# Patient Record
Sex: Male | Born: 1960 | Race: White | Hispanic: No | Marital: Single | Smoking: Current every day smoker
Health system: Southern US, Community
[De-identification: ages and names within clinical notes are randomized; demographics above are authoritative.]

## PROBLEM LIST (undated history)

## (undated) DIAGNOSIS — IMO0002 Reserved for concepts with insufficient information to code with codable children: Secondary | ICD-10-CM

## (undated) DIAGNOSIS — J449 Chronic obstructive pulmonary disease, unspecified: Secondary | ICD-10-CM

## (undated) DIAGNOSIS — M1990 Unspecified osteoarthritis, unspecified site: Secondary | ICD-10-CM

## (undated) DIAGNOSIS — B1920 Unspecified viral hepatitis C without hepatic coma: Secondary | ICD-10-CM

## (undated) HISTORY — PX: CERVICAL FUSION: SHX112

## (undated) HISTORY — PX: NECK SURGERY: SHX720

---

## 2014-01-31 ENCOUNTER — Emergency Department (HOSPITAL_COMMUNITY): Payer: Self-pay

## 2014-01-31 ENCOUNTER — Emergency Department (HOSPITAL_COMMUNITY)
Admission: EM | Admit: 2014-01-31 | Discharge: 2014-01-31 | Disposition: A | Discharge status: Home / Self Care | Payer: Self-pay | Attending: Emergency Medicine | Admitting: Emergency Medicine

## 2014-01-31 ENCOUNTER — Encounter (HOSPITAL_COMMUNITY): Payer: Self-pay | Admitting: Emergency Medicine

## 2014-01-31 DIAGNOSIS — IMO0001 Reserved for inherently not codable concepts without codable children: Secondary | ICD-10-CM | POA: Insufficient documentation

## 2014-01-31 DIAGNOSIS — Z8739 Personal history of other diseases of the musculoskeletal system and connective tissue: Secondary | ICD-10-CM | POA: Insufficient documentation

## 2014-01-31 DIAGNOSIS — J449 Chronic obstructive pulmonary disease, unspecified: Secondary | ICD-10-CM | POA: Insufficient documentation

## 2014-01-31 DIAGNOSIS — F17200 Nicotine dependence, unspecified, uncomplicated: Secondary | ICD-10-CM | POA: Insufficient documentation

## 2014-01-31 DIAGNOSIS — R404 Transient alteration of awareness: Secondary | ICD-10-CM | POA: Insufficient documentation

## 2014-01-31 DIAGNOSIS — M545 Low back pain, unspecified: Secondary | ICD-10-CM | POA: Insufficient documentation

## 2014-01-31 DIAGNOSIS — R55 Syncope and collapse: Secondary | ICD-10-CM | POA: Insufficient documentation

## 2014-01-31 DIAGNOSIS — M7918 Myalgia, other site: Secondary | ICD-10-CM

## 2014-01-31 DIAGNOSIS — M542 Cervicalgia: Secondary | ICD-10-CM | POA: Insufficient documentation

## 2014-01-31 DIAGNOSIS — J4489 Other specified chronic obstructive pulmonary disease: Secondary | ICD-10-CM | POA: Insufficient documentation

## 2014-01-31 DIAGNOSIS — R51 Headache: Secondary | ICD-10-CM | POA: Insufficient documentation

## 2014-01-31 HISTORY — DX: Reserved for concepts with insufficient information to code with codable children: IMO0002

## 2014-01-31 HISTORY — DX: Chronic obstructive pulmonary disease, unspecified: J44.9

## 2014-01-31 HISTORY — DX: Unspecified osteoarthritis, unspecified site: M19.90

## 2014-01-31 LAB — BASIC METABOLIC PANEL
Anion gap: 10 (ref 5–15)
BUN: 7 mg/dL (ref 6–23)
CO2: 25 mEq/L (ref 19–32)
Calcium: 8.9 mg/dL (ref 8.4–10.5)
Chloride: 107 mEq/L (ref 96–112)
Creatinine, Ser: 0.7 mg/dL (ref 0.50–1.35)
GFR calc non Af Amer: >90 mL/min (ref >90)
GLUCOSE: 79 mg/dL (ref 70–99)
POTASSIUM: 4.6 meq/L (ref 3.7–5.3)
Sodium: 142 mEq/L (ref 137–147)

## 2014-01-31 LAB — CBC
HEMATOCRIT: 44.2 % (ref 39.0–52.0)
HEMOGLOBIN: 14.9 g/dL (ref 13.0–17.0)
MCH: 32.5 pg (ref 26.0–34.0)
MCHC: 33.7 g/dL (ref 30.0–36.0)
MCV: 96.3 fL (ref 78.0–100.0)
Platelets: 216 10*3/uL (ref 150–400)
RBC: 4.59 MIL/uL (ref 4.22–5.81)
RDW: 14.1 % (ref 11.5–15.5)
WBC: 2.9 10*3/uL — AB (ref 4.0–10.5)

## 2014-01-31 MED ORDER — HYDROMORPHONE HCL PF 1 MG/ML IJ SOLN: 2.0000 mg | Freq: Once | INTRAMUSCULAR | Status: DC

## 2014-01-31 MED ORDER — HYDROMORPHONE HCL PF 1 MG/ML IJ SOLN
1.0000 mg | Freq: Once | INTRAMUSCULAR | Status: AC
  Administered 2014-01-31: 1 mg via INTRAVENOUS
  Filled 2014-01-31: qty 1

## 2014-01-31 MED ORDER — ONDANSETRON HCL 4 MG/2ML IJ SOLN
4.0000 mg | Freq: Once | INTRAMUSCULAR | Status: AC
  Administered 2014-01-31: 4 mg via INTRAVENOUS
  Filled 2014-01-31: qty 2

## 2014-01-31 MED ORDER — SODIUM CHLORIDE 0.9 % IV BOLUS (SEPSIS)
1000.0000 mL | Freq: Once | INTRAVENOUS | Status: AC
  Administered 2014-01-31: 1000 mL via INTRAVENOUS

## 2014-01-31 MED ORDER — TRAMADOL HCL 50 MG PO TABS: 50.0000 mg | ORAL_TABLET | Freq: Four times a day (QID) | ORAL | Status: DC | PRN

## 2014-01-31 NOTE — Discharge Instructions (Signed)
Please read and follow all provided instructions.  Your diagnoses today include:  1. Syncope, unspecified syncope type   2. Musculoskeletal pain     Tests performed today include:  An EKG of your heart  Blood counts and electrolytes  Vital signs. See below for your results today.   Medications prescribed:   None  Take any prescribed medications only as directed.  Follow-up instructions: Please follow-up with your primary care provider as soon as you can for further evaluation of your symptoms.   Return instructions:  SEEK IMMEDIATE MEDICAL ATTENTION IF:  You have severe chest pain, especially if the pain is crushing or pressure-like and spreads to the arms, back, neck, or jaw, or if you have sweating, nausea (feeling sick to your stomach), or shortness of breath. THIS IS AN EMERGENCY. Don't wait to see if the pain will go away. Get medical help at once. Call 911 or [phone] (operator). DO NOT drive yourself to the hospital.   Your chest pain gets worse and does not go away with rest.   You have an attack of chest pain lasting longer than usual, despite rest and treatment with the medications your caregiver has prescribed.   You wake from sleep with chest pain or shortness of breath.  You feel dizzy or faint.  You have chest pain not typical of your usual pain for which you originally saw your caregiver.   You have any other emergent concerns regarding your health.  Your vital signs today were: BP 141/98   Pulse 65   Temp(Src) 98 F (36.7 C)   Resp 12   Ht 5\' 9"  (1.753 m)   Wt 140 lb (63.504 kg)   BMI 20.67 kg/m2   SpO2 100% If your blood pressure (BP) was elevated above 135/85 this visit, please have this repeated by your doctor within one month. --------------

## 2014-01-31 NOTE — ED Provider Notes (Signed)
Medical screening examination/treatment/procedure(s) were performed by non-physician practitioner and as supervising physician I was immediately available for consultation/collaboration.   EKG Interpretation   Date/Time:  Thursday January 31 2014 10:21:48 EDT Ventricular Rate:  65 PR Interval:  136 QRS Duration: 95 QT Interval:  411 QTC Calculation: 427 R Axis:   79 Text Interpretation:  Sinus rhythm Probable anteroseptal infarct, old No  prior for comparison Confirmed by HORTON  MD, COURTNEY ([id number]) on  01/31/2014 10:40:01 AM        Shon Batonourtney F Horton, MD 01/31/14 1909

## 2014-01-31 NOTE — ED Provider Notes (Signed)
CSN: [id number]     Arrival date & time 01/31/14  4098 History   First MD Initiated Contact with Patient 01/31/14 1004     Chief Complaint  Patient presents with  . Loss of Consciousness     (Consider location/radiation/quality/duration/timing/severity/associated sxs/prior Treatment) HPI Comments: Patient with no past cardiac history presents with complaint of syncope and pain after a fall. Patient states he was walking from his mailbox when he began to feel lightheaded and nauseous. He felt this way for about 60 seconds and states that he saw spots in his vision and then passed out and lost consciousness. Patient awoke on the ground. He complains of neck pain, lower back pain. He thinks that he likely struck his head when he fell. He also complains of decreased sensation in bilateral hands. Patient has a history of herniated disc surgery in his neck. No upper or lower extremity pain. No vomiting. No urinary symptoms. Patient states that he was engaged in strenuous activity outside yesterday but took care to hydrate himself well, after having a heat episode of heat exhaustion several weeks ago. The onset of this condition was acute. The course is constant. Aggravating factors: none. Alleviating factors: none.     Patient is a 53 y.o. male presenting with syncope. The history is provided by the patient.  Loss of Consciousness Associated symptoms: headaches   Associated symptoms: no chest pain, no confusion, no dizziness, no fever, no nausea, no shortness of breath, no vomiting and no weakness     Past Medical History  Diagnosis Date  . COPD (chronic obstructive pulmonary disease)   . Arthritis   . Herniated disc     L6   Past Surgical History  Procedure Laterality Date  . Cervical fusion      c3, c4, c5, c6   No family history on file. History  Substance Use Topics  . Smoking status: Current Every Day Smoker    Types: Cigarettes  . Smokeless tobacco: Not on file  . Alcohol Use:  Yes     Comment: beer a night    Review of Systems  Constitutional: Negative for fever and fatigue.  HENT: Negative for rhinorrhea, sore throat and tinnitus.   Eyes: Negative for photophobia, pain, redness and visual disturbance.  Respiratory: Negative for cough and shortness of breath.   Cardiovascular: Positive for syncope. Negative for chest pain.  Gastrointestinal: Negative for nausea, vomiting, abdominal pain and diarrhea.  Genitourinary: Negative for dysuria.  Musculoskeletal: Positive for back pain and neck pain. Negative for gait problem and myalgias.  Skin: Negative for rash and wound.  Neurological: Positive for headaches. Negative for dizziness, weakness, light-headedness and numbness.  Psychiatric/Behavioral: Negative for confusion and decreased concentration.    Allergies  Naproxen  Home Medications   Prior to Admission medications   Not on File   BP 159/102  Pulse 72  Temp(Src) 98 F (36.7 C)  Resp 18  Ht 5\' 9"  (1.753 m)  Wt 140 lb (63.504 kg)  BMI 20.67 kg/m2  SpO2 99%  Physical Exam  Nursing note and vitals reviewed. Constitutional: He is oriented to person, place, and time. He appears well-developed and well-nourished.  HENT:  Head: Normocephalic and atraumatic. Head is without raccoon's eyes and without Battle's sign.  Right Ear: Tympanic membrane, external ear and ear canal normal. No hemotympanum.  Left Ear: Tympanic membrane, external ear and ear canal normal. No hemotympanum.  Nose: Nose normal. No nasal septal hematoma.  Mouth/Throat: Oropharynx is clear and moist.  Eyes: Conjunctivae, EOM and lids are normal. Pupils are equal, round, and reactive to light. Right eye exhibits no discharge. Left eye exhibits no discharge.  No visible hyphema  Neck: Neck supple.  Immobilized in c-collar.  Cardiovascular: Normal rate, regular rhythm and normal heart sounds.   No murmur heard. Pulmonary/Chest: Effort normal and breath sounds normal. No respiratory  distress. He has no wheezes. He has no rales.  Abdominal: Soft. Bowel sounds are normal. There is no tenderness. There is no rebound and no guarding.  Musculoskeletal: He exhibits tenderness.       Right shoulder: Normal.       Left shoulder: Normal.       Right elbow: Normal.      Left elbow: Normal.       Right wrist: Normal.       Left wrist: Normal.       Right hip: Normal.       Left hip: Normal.       Right knee: Normal.       Left knee: Normal.       Right ankle: Normal.       Left ankle: Normal.       Cervical back: He exhibits tenderness and bony tenderness. He exhibits normal range of motion.       Thoracic back: He exhibits no tenderness and no bony tenderness.       Lumbar back: He exhibits tenderness and bony tenderness.  Neurological: He is alert and oriented to person, place, and time. He has normal strength and normal reflexes. No cranial nerve deficit or sensory deficit. Coordination normal. GCS eye subscore is 4. GCS verbal subscore is 5. GCS motor subscore is 6.  Skin: Skin is warm and dry.  Psychiatric: He has a normal mood and affect.    ED Course  Procedures (including critical care time) Labs Review Labs Reviewed  CBC - Abnormal; Notable for the following:    WBC 2.9 (*)    All other components within normal limits  BASIC METABOLIC PANEL    Imaging Review Dg Lumbar Spine Complete  01/31/2014   CLINICAL DATA:  Status post fall today with low back pain.  EXAM: LUMBAR SPINE - COMPLETE 4+ VIEW  COMPARISON:  None.  FINDINGS: Vertebral body height is maintained. There is multilevel degenerative disc disease with loss of disc space height worst at L2-3 and L3-4. 1.3 cm retrolisthesis L3 on L4 due to degenerative disease is noted. Alignment is otherwise unremarkable. Visualized paraspinal structures appear normal.  IMPRESSION: No acute finding.  Multilevel marked degenerative change.   Electronically Signed   By: Drusilla Kanner M.D.   On: 01/31/2014 10:53   Ct  Head Wo Contrast  01/31/2014   CLINICAL DATA:  Syncopal episode.  Fall from a standing position.  EXAM: CT HEAD WITHOUT CONTRAST  CT CERVICAL SPINE WITHOUT CONTRAST  TECHNIQUE: Multidetector CT imaging of the head and cervical spine was performed following the standard protocol without intravenous contrast. Multiplanar CT image reconstructions of the cervical spine were also generated.  COMPARISON:  None.  FINDINGS: CT HEAD FINDINGS  No acute infarct, hemorrhage, or mass lesion is present. The ventricles are of normal size. No significant extraaxial fluid collection is present.  Mild mucosal thickening is present throughout the ethmoid air cells, sphenoid sinuses, and inferior frontal sinuses bilaterally. The visualized mastoid air cells are clear. The visualized maxillary sinuses are clear. The osseous skull is intact. No significant extracranial soft tissue injury is evident.  CT CERVICAL SPINE FINDINGS  The patient is fused anteriorly and posteriorly at C3-4, C4-5, and C5-6. Fusion is solid. Alignment is anatomic at C2-3. There slight retrolisthesis and loss of disc height at C6-7. Uncovertebral spurring results in osseous foraminal narrowing on the left at C6-7. The hardware is intact. The soft tissues are unremarkable.  No acute fracture is evident.  IMPRESSION: 1. Normal CT appearance of the brain.  No evidence for acute trauma. 2. Mild generalized mucosal thickening throughout the paranasal sinuses. 3. Solid fusion C3-4, C4-5, and C5-6. 4. Moderate adjacent level disease at C6-7, left greater than right. 5. No acute abnormality of the cervical spine.   Electronically Signed   By: Gennette Pac M.D.   On: 01/31/2014 11:33   Ct Cervical Spine Wo Contrast  01/31/2014   CLINICAL DATA:  Syncopal episode.  Fall from a standing position.  EXAM: CT HEAD WITHOUT CONTRAST  CT CERVICAL SPINE WITHOUT CONTRAST  TECHNIQUE: Multidetector CT imaging of the head and cervical spine was performed following the standard  protocol without intravenous contrast. Multiplanar CT image reconstructions of the cervical spine were also generated.  COMPARISON:  None.  FINDINGS: CT HEAD FINDINGS  No acute infarct, hemorrhage, or mass lesion is present. The ventricles are of normal size. No significant extraaxial fluid collection is present.  Mild mucosal thickening is present throughout the ethmoid air cells, sphenoid sinuses, and inferior frontal sinuses bilaterally. The visualized mastoid air cells are clear. The visualized maxillary sinuses are clear. The osseous skull is intact. No significant extracranial soft tissue injury is evident.  CT CERVICAL SPINE FINDINGS  The patient is fused anteriorly and posteriorly at C3-4, C4-5, and C5-6. Fusion is solid. Alignment is anatomic at C2-3. There slight retrolisthesis and loss of disc height at C6-7. Uncovertebral spurring results in osseous foraminal narrowing on the left at C6-7. The hardware is intact. The soft tissues are unremarkable.  No acute fracture is evident.  IMPRESSION: 1. Normal CT appearance of the brain.  No evidence for acute trauma. 2. Mild generalized mucosal thickening throughout the paranasal sinuses. 3. Solid fusion C3-4, C4-5, and C5-6. 4. Moderate adjacent level disease at C6-7, left greater than right. 5. No acute abnormality of the cervical spine.   Electronically Signed   By: Gennette Pac M.D.   On: 01/31/2014 11:33     EKG Interpretation   Date/Time:  Thursday January 31 2014 10:21:48 EDT Ventricular Rate:  65 PR Interval:  136 QRS Duration: 95 QT Interval:  411 QTC Calculation: 427 R Axis:   79 Text Interpretation:  Sinus rhythm Probable anteroseptal infarct, old No  prior for comparison Confirmed by HORTON  MD, COURTNEY ([id number]) on  01/31/2014 10:40:01 AM      10:22 AM Patient seen and examined. Work-up initiated. Medications ordered.   Vital signs reviewed and are as follows: Filed Vitals:   01/31/14 1008  BP: 159/102  Pulse: 72  Temp: 98 F  (36.7 C)  Resp: 18   Imaging negative. C-collar removed. Patient has full range of motion in neck.  Patient discussed with Dr. Wilkie Aye. Metastatic vital signs are unremarkable however patient does feel lightheaded with standing. No full syncope. Symptoms are transient.  After discussion with Dr. Wilkie Aye, do not feel this is likely cardiogenic in nature. Likely orthostatic in nature.   Patient informed of all results. He is working with his Child psychotherapist to procure PCP care. Patient given referral to wellness Center and also for cardiology for followup. I encouraged followup.  Patient urged to return with chest pain and shortness of breath, additional episodes of syncope, or other concerns.  Patient was discharged home with tramadol for pain. Patient counseled on likely course of soreness and stiffness. Counseled on signs and symptoms to return.  Patient counseled on use of narcotic pain medications. Counseled not to combine these medications with others containing tylenol. Urged not to drink alcohol, drive, or perform any other activities that requires focus while taking these medications. The patient verbalizes understanding and agrees with the plan.    MDM   Final diagnoses:  Syncope, unspecified syncope type  Musculoskeletal pain   Syncope: there is definitely an orthostatic component, positive prodrome, no chest pain or shortness of breath. EKG without signs of Brugada syndrome, QTC prolongation, no WPW, or other arrhythmic signs. No sudden cardiac death in first degree relatives. Feel that outpatient f/u with PCP/cardiology is reasonable. Patient has Child psychotherapistsocial worker who is helping with finding him care.     Renne CriglerJoshua Vinson Tietze, PA-C 01/31/14 1635

## 2014-01-31 NOTE — ED Notes (Signed)
Warm blanket given. [place] sandwich, apple sauce, and coffee given; nurse aware.

## 2014-01-31 NOTE — ED Notes (Signed)
Pt. Was standing on his porch saw white dots and then woke up on the ground approximately 1 foot fall. No viisible injuries,  Reports having lumbar/thoracic pain and posterior neck pain.  Alert and oriented X3.

## 2014-02-06 ENCOUNTER — Observation Stay (HOSPITAL_COMMUNITY)
Admission: EM | Admit: 2014-02-06 | Discharge: 2014-02-07 | Disposition: A | Discharge status: Home / Self Care | Payer: Self-pay | Attending: Cardiovascular Disease | Admitting: Cardiovascular Disease

## 2014-02-06 ENCOUNTER — Encounter (HOSPITAL_COMMUNITY): Payer: Self-pay | Admitting: Emergency Medicine

## 2014-02-06 ENCOUNTER — Emergency Department (HOSPITAL_COMMUNITY): Payer: Self-pay

## 2014-02-06 DIAGNOSIS — G8929 Other chronic pain: Secondary | ICD-10-CM | POA: Insufficient documentation

## 2014-02-06 DIAGNOSIS — S61409A Unspecified open wound of unspecified hand, initial encounter: Secondary | ICD-10-CM | POA: Insufficient documentation

## 2014-02-06 DIAGNOSIS — M129 Arthropathy, unspecified: Secondary | ICD-10-CM | POA: Insufficient documentation

## 2014-02-06 DIAGNOSIS — R42 Dizziness and giddiness: Secondary | ICD-10-CM | POA: Insufficient documentation

## 2014-02-06 DIAGNOSIS — R0789 Other chest pain: Secondary | ICD-10-CM | POA: Insufficient documentation

## 2014-02-06 DIAGNOSIS — Z8619 Personal history of other infectious and parasitic diseases: Secondary | ICD-10-CM | POA: Insufficient documentation

## 2014-02-06 DIAGNOSIS — Z23 Encounter for immunization: Secondary | ICD-10-CM | POA: Insufficient documentation

## 2014-02-06 DIAGNOSIS — Y929 Unspecified place or not applicable: Secondary | ICD-10-CM | POA: Insufficient documentation

## 2014-02-06 DIAGNOSIS — R55 Syncope and collapse: Principal | ICD-10-CM | POA: Insufficient documentation

## 2014-02-06 DIAGNOSIS — IMO0002 Reserved for concepts with insufficient information to code with codable children: Secondary | ICD-10-CM | POA: Insufficient documentation

## 2014-02-06 DIAGNOSIS — F17200 Nicotine dependence, unspecified, uncomplicated: Secondary | ICD-10-CM | POA: Insufficient documentation

## 2014-02-06 DIAGNOSIS — Y939 Activity, unspecified: Secondary | ICD-10-CM | POA: Insufficient documentation

## 2014-02-06 DIAGNOSIS — E43 Unspecified severe protein-calorie malnutrition: Secondary | ICD-10-CM | POA: Insufficient documentation

## 2014-02-06 DIAGNOSIS — X58XXXA Exposure to other specified factors, initial encounter: Secondary | ICD-10-CM | POA: Insufficient documentation

## 2014-02-06 HISTORY — DX: Unspecified viral hepatitis C without hepatic coma: B19.20

## 2014-02-06 LAB — CBC WITH DIFFERENTIAL/PLATELET
Basophils Absolute: 0 10*3/uL (ref 0.0–0.1)
Basophils Relative: 1 % (ref 0–1)
EOS ABS: 0.1 10*3/uL (ref 0.0–0.7)
Eosinophils Relative: 3 % (ref 0–5)
HCT: 44.8 % (ref 39.0–52.0)
Hemoglobin: 15.3 g/dL (ref 13.0–17.0)
LYMPHS ABS: 1.1 10*3/uL (ref 0.7–4.0)
Lymphocytes Relative: 33 % (ref 12–46)
MCH: 32.6 pg (ref 26.0–34.0)
MCHC: 34.2 g/dL (ref 30.0–36.0)
MCV: 95.5 fL (ref 78.0–100.0)
MONOS PCT: 22 % — AB (ref 3–12)
Monocytes Absolute: 0.8 10*3/uL (ref 0.1–1.0)
NEUTROS PCT: 41 % — AB (ref 43–77)
Neutro Abs: 1.5 10*3/uL — ABNORMAL LOW (ref 1.7–7.7)
Platelets: 243 10*3/uL (ref 150–400)
RBC: 4.69 MIL/uL (ref 4.22–5.81)
RDW: 14 % (ref 11.5–15.5)
WBC: 3.5 10*3/uL — ABNORMAL LOW (ref 4.0–10.5)

## 2014-02-06 LAB — COMPREHENSIVE METABOLIC PANEL
ALK PHOS: 52 U/L (ref 39–117)
ALT: 157 U/L — ABNORMAL HIGH (ref 0–53)
ANION GAP: 14 (ref 5–15)
AST: 106 U/L — ABNORMAL HIGH (ref 0–37)
Albumin: 3.6 g/dL (ref 3.5–5.2)
BILIRUBIN TOTAL: 0.3 mg/dL (ref 0.3–1.2)
BUN: 9 mg/dL (ref 6–23)
CO2: 23 mEq/L (ref 19–32)
Calcium: 9.2 mg/dL (ref 8.4–10.5)
Chloride: 106 mEq/L (ref 96–112)
Creatinine, Ser: 0.69 mg/dL (ref 0.50–1.35)
GLUCOSE: 86 mg/dL (ref 70–99)
POTASSIUM: 4.4 meq/L (ref 3.7–5.3)
Sodium: 143 mEq/L (ref 137–147)
Total Protein: 7 g/dL (ref 6.0–8.3)

## 2014-02-06 LAB — I-STAT TROPONIN, ED: Troponin i, poc: 0 ng/mL (ref 0.00–0.08)

## 2014-02-06 MED ORDER — SODIUM CHLORIDE 0.9 % IJ SOLN
3.0000 mL | Freq: Two times a day (BID) | INTRAMUSCULAR | Status: DC
  Administered 2014-02-06: 3 mL via INTRAVENOUS

## 2014-02-06 MED ORDER — HEPARIN SODIUM (PORCINE) 5000 UNIT/ML IJ SOLN
5000.0000 [IU] | Freq: Three times a day (TID) | INTRAMUSCULAR | Status: DC
  Administered 2014-02-06 – 2014-02-07 (×2): 5000 [IU] via SUBCUTANEOUS
  Filled 2014-02-06 (×4): qty 1

## 2014-02-06 MED ORDER — TETANUS-DIPHTH-ACELL PERTUSSIS 5-2.5-18.5 LF-MCG/0.5 IM SUSP
0.5000 mL | Freq: Once | INTRAMUSCULAR | Status: AC
Start: 2014-02-06 — End: 2014-02-06
  Administered 2014-02-06: 0.5 mL via INTRAMUSCULAR
  Filled 2014-02-06: qty 0.5

## 2014-02-06 MED ORDER — MORPHINE SULFATE 4 MG/ML IJ SOLN
4.0000 mg | Freq: Once | INTRAMUSCULAR | Status: AC
  Administered 2014-02-06: 4 mg via INTRAVENOUS
  Filled 2014-02-06: qty 1

## 2014-02-06 MED ORDER — DOCUSATE SODIUM 100 MG PO CAPS
100.0000 mg | ORAL_CAPSULE | Freq: Two times a day (BID) | ORAL | Status: DC | PRN
  Filled 2014-02-06: qty 1

## 2014-02-06 MED ORDER — CLINDAMYCIN HCL 300 MG PO CAPS
300.0000 mg | ORAL_CAPSULE | Freq: Four times a day (QID) | ORAL | Status: DC
  Administered 2014-02-07 (×4): 300 mg via ORAL
  Filled 2014-02-06 (×7): qty 1

## 2014-02-06 MED ORDER — HYDROCODONE-ACETAMINOPHEN 5-325 MG PO TABS
1.0000 | ORAL_TABLET | Freq: Four times a day (QID) | ORAL | Status: DC | PRN
  Administered 2014-02-06 – 2014-02-07 (×4): 2 via ORAL
  Filled 2014-02-06 (×4): qty 2

## 2014-02-06 MED ORDER — ACETAMINOPHEN 325 MG PO TABS: 650.0000 mg | ORAL_TABLET | Freq: Four times a day (QID) | ORAL | Status: DC | PRN

## 2014-02-06 MED ORDER — ONDANSETRON HCL 4 MG PO TABS: 4.0000 mg | ORAL_TABLET | Freq: Three times a day (TID) | ORAL | Status: DC | PRN

## 2014-02-06 MED ORDER — SODIUM CHLORIDE 0.9 % IV BOLUS (SEPSIS)
1000.0000 mL | Freq: Once | INTRAVENOUS | Status: AC
  Administered 2014-02-06: 1000 mL via INTRAVENOUS

## 2014-02-06 NOTE — ED Notes (Signed)
Per EMS pt from home c/o dizziness pas 3 days worst today. No syncope or recent falls. Evaluated on 7/30 for syncopal episode r/t dizziness and sent home.

## 2014-02-06 NOTE — ED Provider Notes (Signed)
CSN: [id number]     Arrival date & time 02/06/14  1454 History   First MD Initiated Contact with Patient 02/06/14 1457     No chief complaint on file.    (Consider location/radiation/quality/duration/timing/severity/associated sxs/prior Treatment) The history is provided by the patient.  Ryan Knox is a 53 y.o. male hx of COPD, chronic back pain here with dizziness and near syncope. He was walking and suddenly felt light headed and dizzy and almost passed out. Sat down on his buttocks but has no head injury. Has intermittent chest pain and tightness when he lays down over the last week. Came a week ago for syncope and had nl labs and EKG and nl CT head. Never had stress test or cardiac workup. Also tried to grab something with L hand yesterday and had a puncture wound by a nail on left palm area. Unknown tetanus    Past Medical History  Diagnosis Date  . COPD (chronic obstructive pulmonary disease)   . Arthritis   . Herniated disc     L6  . Hepatitis C    Past Surgical History  Procedure Laterality Date  . Cervical fusion      c3, c4, c5, c6   No family history on file. History  Substance Use Topics  . Smoking status: Current Every Day Smoker    Types: Cigarettes  . Smokeless tobacco: Not on file  . Alcohol Use: Yes     Comment: beer a night    Review of Systems  Neurological: Positive for light-headedness.  All other systems reviewed and are negative.     Allergies  Naproxen  Home Medications   Prior to Admission medications   Medication Sig Start Date End Date Taking? Authorizing Provider  traMADol (ULTRAM) 50 MG tablet Take 50 mg by mouth every 6 (six) hours as needed for moderate pain.   Yes Historical Provider, MD   BP 124/77  Pulse 77  Temp(Src) 98.6 F (37 C) (Oral)  Resp 13  Ht 5\' 9"  (1.753 m)  Wt 140 lb (63.504 kg)  BMI 20.67 kg/m2  SpO2 100% Physical Exam  Nursing note and vitals reviewed. Constitutional: He is oriented to person, place, and  time. He appears well-developed and well-nourished.  HENT:  Head: Normocephalic.  Mouth/Throat: Oropharynx is clear and moist.  Eyes: Conjunctivae and EOM are normal. Pupils are equal, round, and reactive to light.  Neck: Normal range of motion. Neck supple.  Cardiovascular: Normal rate, regular rhythm and normal heart sounds.   Pulmonary/Chest: Effort normal and breath sounds normal. No respiratory distress. He has no wheezes. He has no rales.  Abdominal: Soft. Bowel sounds are normal. He exhibits no distension. There is no tenderness. There is no rebound.  Musculoskeletal:  L palm with puncture wound with no obvious cellulitis. No obvious fluctuance   Neurological: He is alert and oriented to person, place, and time. No cranial nerve deficit. Coordination normal.  Skin: Skin is warm and dry.  Psychiatric: He has a normal mood and affect. His behavior is normal. Judgment and thought content normal.    ED Course  FOREIGN BODY REMOVAL Date/Time: 02/06/2014 5:11 PM Performed by: Richardean Canal Authorized by: Richardean Canal Consent: Verbal consent obtained. Risks and benefits: risks, benefits and alternatives were discussed Consent given by: patient Patient understanding: patient states understanding of the procedure being performed Patient consent: the patient's understanding of the procedure matches consent given Procedure consent: procedure consent matches procedure scheduled Relevant documents: relevant documents present  and verified Patient identity confirmed: verbally with patient and arm band Intake: L hand  Anesthesia: local infiltration Local anesthetic: lidocaine 2% without epinephrine Anesthetic total: 5 ml Patient sedated: no Patient restrained: no Patient cooperative: yes Complexity: simple 2 objects recovered. Objects recovered: small pieces of metal  Patient tolerance: Patient tolerated the procedure well with no immediate complications.   (including critical care  time)    Labs Review Labs Reviewed  CBC WITH DIFFERENTIAL - Abnormal; Notable for the following:    WBC 3.5 (*)    Neutrophils Relative % 41 (*)    Neutro Abs 1.5 (*)    Monocytes Relative 22 (*)    All other components within normal limits  COMPREHENSIVE METABOLIC PANEL - Abnormal; Notable for the following:    AST 106 (*)    ALT 157 (*)    All other components within normal limits  BASIC METABOLIC PANEL  CBC  PROTIME-INR  I-STAT TROPOININ, ED    Imaging Review Dg Hand 2 View Left  02/06/2014   CLINICAL DATA:  Foreign body removal.  EXAM: LEFT HAND - 2 VIEW  COMPARISON:  02/06/2014.  FINDINGS: There is no evidence of fracture or dislocation. There is no evidence of arthropathy or other focal bone abnormality. Two tiny pinpoint metallic foreign bodies are again noted over the soft tissues of the palm at the level of the mid portion of the third metacarpal.  IMPRESSION: Punctate metallic foreign bodies noted over the palm of the hand and unchanged in position. These are at the level of the midportion of the third metacarpal. No acute bony abnormality .   Electronically Signed   By: Maisie Fushomas  Register   On: 02/06/2014 17:34   Dg Hand Complete Left  02/06/2014   CLINICAL DATA:  Left hand injury with a crusty male  EXAM: LEFT HAND - COMPLETE 3+ VIEW  COMPARISON:  None.  FINDINGS: There is no evidence of fracture or dislocation. There is no evidence of arthropathy or other focal bone abnormality. Two punctate foreign bodies projecting in the soft tissue of the palm at the level of the third metacarpal.  IMPRESSION: Two punctate foreign bodies projecting in the soft tissue of the palm at the level of the third metacarpal.   Electronically Signed   By: Elige KoHetal  Patel   On: 02/06/2014 16:09     EKG Interpretation   Date/Time:  Wednesday February 06 2014 14:58:07 EDT Ventricular Rate:  78 PR Interval:  133 QRS Duration: 89 QT Interval:  372 QTC Calculation: 424 R Axis:   79 Text  Interpretation:  Sinus rhythm Anteroseptal infarct, age indeterminate  No significant change since last tracing Confirmed by YAO  MD, DAVID  ([id number]) on 02/06/2014 4:15:13 PM      MDM   Final diagnoses:  None   Ryan Knox is a 53 y.o. male here with near syncope. Syncopize twice over last week. Will check orthostatics, labs. Will likely admit for syncope workup.    5 PM  Not orthostatic. Labs unremarkable. LFTs elevated but has hep C. Xray L hand showed possible foreign bodies on the palm. I attempted to remove them.   6:20 PM Xray still showed punctate foreign bodies after I attempted to remove it. Tetanus updated. I called Dr. Mina MarbleWeingold, who recommend placing patient on abx. He doesn't want to operate to take it out. Recommend warm soaks and outpatient f/u.     Richardean Canalavid H Yao, MD 02/06/14 [phone]

## 2014-02-06 NOTE — H&P (Signed)
Referring Physician:  Markeese Boyajian is an 53 y.o. male.                       Chief Complaint: Syncopy and chest pain  HPI: Ryan Knox is a 53 y.o. male hx of COPD, chronic back pain here with dizziness and near syncope. He was walking and suddenly felt light headed and dizzy and almost passed out. Sat down on his buttocks but has no head injury. Has intermittent chest pain and tightness when he lays down over the last week. Came a week ago for syncope and had nl labs and EKG and nl CT head. Never had stress test or cardiac workup. Also tried to grab something with L hand yesterday and had a puncture wound by a nail on left palm area. X-ray positive for foreign bodies in hand. A hand surgeon is consulted. Tdap booster shot is given in ER.   Past Medical History  Diagnosis Date  . COPD (chronic obstructive pulmonary disease)   . Arthritis   . Herniated disc     L6  . Hepatitis C       Past Surgical History  Procedure Laterality Date  . Cervical fusion      c3, c4, c5, c6    No family history on file. Social History:  reports that he has been smoking Cigarettes.  He has been smoking about 0.00 packs per day. He does not have any smokeless tobacco history on file. He reports that he drinks alcohol. He reports that he does not use illicit drugs.  Allergies:  Allergies  Allergen Reactions  . Naproxen     Nausea and vomiting     (Not in a hospital admission)  Results for orders placed during the hospital encounter of 02/06/14 (from the past 48 hour(s))  CBC WITH DIFFERENTIAL     Status: Abnormal   Collection Time    02/06/14  3:10 PM      Result Value Ref Range   WBC 3.5 (*) 4.0 - 10.5 K/uL   RBC 4.69  4.22 - 5.81 MIL/uL   Hemoglobin 15.3  13.0 - 17.0 g/dL   HCT 44.8  39.0 - 52.0 %   MCV 95.5  78.0 - 100.0 fL   MCH 32.6  26.0 - 34.0 pg   MCHC 34.2  30.0 - 36.0 g/dL   RDW 14.0  11.5 - 15.5 %   Platelets 243  150 - 400 K/uL   Neutrophils Relative % 41 (*) 43 - 77 %   Neutro Abs 1.5 (*) 1.7 - 7.7 K/uL   Lymphocytes Relative 33  12 - 46 %   Lymphs Abs 1.1  0.7 - 4.0 K/uL   Monocytes Relative 22 (*) 3 - 12 %   Monocytes Absolute 0.8  0.1 - 1.0 K/uL   Eosinophils Relative 3  0 - 5 %   Eosinophils Absolute 0.1  0.0 - 0.7 K/uL   Basophils Relative 1  0 - 1 %   Basophils Absolute 0.0  0.0 - 0.1 K/uL  COMPREHENSIVE METABOLIC PANEL     Status: Abnormal   Collection Time    02/06/14  3:10 PM      Result Value Ref Range   Sodium 143  137 - 147 mEq/L   Potassium 4.4  3.7 - 5.3 mEq/L   Chloride 106  96 - 112 mEq/L   CO2 23  19 - 32 mEq/L   Glucose, Bld 86  70 -  99 mg/dL   BUN 9  6 - 23 mg/dL   Creatinine, Ser 0.69  0.50 - 1.35 mg/dL   Calcium 9.2  8.4 - 10.5 mg/dL   Total Protein 7.0  6.0 - 8.3 g/dL   Albumin 3.6  3.5 - 5.2 g/dL   AST 106 (*) 0 - 37 U/L   ALT 157 (*) 0 - 53 U/L   Alkaline Phosphatase 52  39 - 117 U/L   Total Bilirubin 0.3  0.3 - 1.2 mg/dL   GFR calc non Af Amer >90  >90 mL/min   GFR calc Af Amer >90  >90 mL/min   Comment: (NOTE)     The eGFR has been calculated using the CKD EPI equation.     This calculation has not been validated in all clinical situations.     eGFR's persistently <90 mL/min signify possible Chronic Kidney     Disease.   Anion gap 14  5 - 15  I-STAT TROPOININ, ED     Status: None   Collection Time    02/06/14  3:18 PM      Result Value Ref Range   Troponin i, poc 0.00  0.00 - 0.08 ng/mL   Comment 3            Comment: Due to the release kinetics of cTnI,     a negative result within the first hours     of the onset of symptoms does not rule out     myocardial infarction with certainty.     If myocardial infarction is still suspected,     repeat the test at appropriate intervals.   Dg Hand 2 View Left  02/06/2014   CLINICAL DATA:  Foreign body removal.  EXAM: LEFT HAND - 2 VIEW  COMPARISON:  02/06/2014.  FINDINGS: There is no evidence of fracture or dislocation. There is no evidence of arthropathy or other  focal bone abnormality. Two tiny pinpoint metallic foreign bodies are again noted over the soft tissues of the palm at the level of the mid portion of the third metacarpal.  IMPRESSION: Punctate metallic foreign bodies noted over the palm of the hand and unchanged in position. These are at the level of the midportion of the third metacarpal. No acute bony abnormality .   Electronically Signed   By: Marcello Moores  Register   On: 02/06/2014 17:34   Dg Hand Complete Left  02/06/2014   CLINICAL DATA:  Left hand injury with a crusty male  EXAM: LEFT HAND - COMPLETE 3+ VIEW  COMPARISON:  None.  FINDINGS: There is no evidence of fracture or dislocation. There is no evidence of arthropathy or other focal bone abnormality. Two punctate foreign bodies projecting in the soft tissue of the palm at the level of the third metacarpal.  IMPRESSION: Two punctate foreign bodies projecting in the soft tissue of the palm at the level of the third metacarpal.   Electronically Signed   By: Kathreen Devoid   On: 02/06/2014 16:09    Review Of Systems Constitutional: Negative for fever, chills, weight loss, malaise/fatigue and diaphoresis.  Eyes: Negative for blurred vision.  Respiratory: Negative for cough and shortness of breath.  Cardiovascular: Negative for chest pain and palpitations.  Gastrointestinal: Negative for nausea and vomiting.  Genitourinary: Negative for dysuria, frequency and hematuria.  Musculoskeletal: Positive for neck pain.  Skin: Negative for rash.  Neurological: Positive for loss of consciousness. Negative for dizziness, tremors, weakness and headaches.  Psychiatric/Behavioral: Negative for depression  Blood pressure 124/80, pulse 78, temperature 98.6 F (37 C), temperature source Oral, resp. rate 12, height _0  (1.753 m), weight 63.504 kg (140 lb), SpO2 98.00%.  Physical Exam  Nursing note and vitals reviewed.  Constitutional:  He appears averagely-developed and nourished.  HENT: Head: Normocephalic.  Mouth/Throat: Oropharynx is clear and moist.  Eyes: Conjunctivae and EOM are normal. Pupils are equal, round, and reactive to light.  Neck: 50 % ROM. Neck supple. Surgical scar seen anteriorly. Cardiovascular: Normal rate, regular rhythm and normal heart sounds.  Pulmonary/Chest: Effort normal and breath sounds normal. He has no wheezes. He has no rales.  Abdominal: Soft. Bowel sounds are normal. He exhibits no distension. There is no tenderness. There is no rebound.  Musculoskeletal: L palm with puncture wound with no obvious cellulitis. Dressing applied.  Neurological: He is alert and oriented to person, place, and time. No cranial nerve deficit. Coordination normal.  Skin: Skin is warm and dry.  Psychiatric: He has a normal mood and affect. His behavior is normal. Judgment and thought content normal.  Assessment/Plan Syncope Chest pain Cervical disc disease with mulyiple surgeries COPD  Place in observation Nuclear stress test in AM.  Birdie Riddle, MD  02/06/2014, 5:52 PM

## 2014-02-07 ENCOUNTER — Encounter (HOSPITAL_COMMUNITY): Payer: Self-pay

## 2014-02-07 ENCOUNTER — Observation Stay (HOSPITAL_COMMUNITY): Payer: Self-pay

## 2014-02-07 DIAGNOSIS — E43 Unspecified severe protein-calorie malnutrition: Secondary | ICD-10-CM | POA: Insufficient documentation

## 2014-02-07 LAB — BASIC METABOLIC PANEL
Anion gap: 7 (ref 5–15)
BUN: 9 mg/dL (ref 6–23)
CO2: 29 meq/L (ref 19–32)
Calcium: 8.8 mg/dL (ref 8.4–10.5)
Chloride: 104 mEq/L (ref 96–112)
Creatinine, Ser: 0.82 mg/dL (ref 0.50–1.35)
GFR calc Af Amer: >90 mL/min (ref >90)
GFR calc non Af Amer: >90 mL/min (ref >90)
GLUCOSE: 85 mg/dL (ref 70–99)
Potassium: 5.2 mEq/L (ref 3.7–5.3)
Sodium: 140 mEq/L (ref 137–147)

## 2014-02-07 LAB — CBC
HCT: 44.1 % (ref 39.0–52.0)
Hemoglobin: 14.9 g/dL (ref 13.0–17.0)
MCH: 32 pg (ref 26.0–34.0)
MCHC: 33.8 g/dL (ref 30.0–36.0)
MCV: 94.8 fL (ref 78.0–100.0)
Platelets: 257 10*3/uL (ref 150–400)
RBC: 4.65 MIL/uL (ref 4.22–5.81)
RDW: 14.1 % (ref 11.5–15.5)
WBC: 3.2 10*3/uL — AB (ref 4.0–10.5)

## 2014-02-07 LAB — PROTIME-INR
INR: 0.97 (ref 0.00–1.49)
Prothrombin Time: 12.9 seconds (ref 11.6–15.2)

## 2014-02-07 MED ORDER — TECHNETIUM TC 99M SESTAMIBI GENERIC - CARDIOLITE
30.0000 | Freq: Once | INTRAVENOUS | Status: AC | PRN
  Administered 2014-02-07: 30 via INTRAVENOUS

## 2014-02-07 MED ORDER — REGADENOSON 0.4 MG/5ML IV SOLN
0.4000 mg | Freq: Once | INTRAVENOUS | Status: AC
  Filled 2014-02-07: qty 5

## 2014-02-07 MED ORDER — CLINDAMYCIN HCL 300 MG PO CAPS: 300.0000 mg | ORAL_CAPSULE | Freq: Four times a day (QID) | ORAL | Status: DC

## 2014-02-07 MED ORDER — REGADENOSON 0.4 MG/5ML IV SOLN
INTRAVENOUS | Status: AC
  Filled 2014-02-07: qty 5

## 2014-02-07 MED ORDER — TECHNETIUM TC 99M SESTAMIBI - CARDIOLITE
10.0000 | Freq: Once | INTRAVENOUS | Status: AC | PRN
  Administered 2014-02-07: 10 via INTRAVENOUS

## 2014-02-07 MED ORDER — TRAMADOL HCL 50 MG PO TABS: 50.0000 mg | ORAL_TABLET | Freq: Two times a day (BID) | ORAL | Status: DC

## 2014-02-07 NOTE — Progress Notes (Signed)
UR completed 

## 2014-02-07 NOTE — Progress Notes (Signed)
Pt found sitting in floor beside the bed by Irving BurtonEmily, Charity fundraiserN. Pt stated "I was sitting on the side of the bed and was getting up to get in the chair and I tripped over the bedside table. I just split my crack wide open." VSS, pt refused to have backside examined, stating "It's really ok." Pt in a safety surveillance room, video footage reviewed with Charge RN and Database administratornterim Assistant Director which showed pt sitting in the chair then grabbing the bed rail, slightly stand and sit himself slowly in the floor. Then pt pushed bedside table to other side of the room with his foot. He then rested his elbow on the bed and placed his head in his hand.  At the time, Irving Burtonmily then walked into the room. Dr. Algie CofferKadakia made aware of current situation. No new orders received. Stated he will be up to see patient soon. Will continue to monitor. Pt instructed to call before getting out of bed. Bed alarm in place, call bell and phone within reach.

## 2014-02-07 NOTE — Progress Notes (Signed)
Dr Kadakia at bedside 1 min after Lexi. 

## 2014-02-07 NOTE — Discharge Summary (Signed)
Physician Discharge Summary  Patient ID: Ryan SheehanGeorge Rominger MRN: [id number] DOB/AGE: 51/01/1961 51 y.o.  Admit date: 02/06/2014 Discharge date: 02/07/2014  Admission Diagnoses: Syncope  Chest pain  Cervical disc disease with multiple surgeries  COPD  Discharge Diagnoses:  Principle Problem: * Syncope * Protein-calorie malnutrition, severe Chest pain  Cervical disc disease with multiple surgeries  COPD H/O hepatitis C  Discharged Condition: fair  Hospital Course: 53 y.o. male hx of COPD, chronic back pain here with dizziness and near syncope. He was walking and suddenly felt light headed and dizzy and almost passed out. Sat down on his buttocks but has no head injury. Has intermittent chest pain and tightness when he lays down over the last week. Came a week ago for syncope and had nl labs and EKG and nl CT head. Never had stress test or cardiac workup. Also tried to grab something with L hand yesterday and had a puncture wound by a nail on left palm area. X-ray positive for foreign bodies in hand. A hand surgeon is consulted. Tdap booster shot is given in ER. He had Nuclear stress test which was negative for reversible ischemia. He was prescribed Clindamycin for 7-10 days and tramadol twice daily as needed. He will be followed by hand surgeon on OP basis. He was advised to see Primary care doctor.and drink protein supplement.  Consults: cardiology  Significant Diagnostic Studies: labs: Near normal CBC and BMET with mild elevation of liver function test.  EKG-Sinus rhythm.  Nuclear stress test: No evidence for myocardial ischemia or reversibility.  Fixed defect along the inferior wall could be associated with diaphragmatic attenuation. There is no significant motion abnormality in the inferior wall.  Calculated ejection fraction is 64%.  X-ray left hand: Punctate metallic foreign bodies noted over the palm of the hand and  unchanged in position. These are at the level of the midportion of  the third metacarpal. No acute bony abnormality .   Treatments: antibiotics: Clindamycin and analgesia: Tramadol  Discharge Exam: Blood pressure 123/78, pulse 78, temperature 97.7 F (36.5 C), temperature source Oral, resp. rate 16, height 5\' 9"  (1.753 m), weight 61.4 kg (135 lb 5.8 oz), SpO2 99.00%. Nursing note and vitals reviewed.  Constitutional: He appears averagely-developed and poorly nourished.  HENT: Head: Normocephalic. Mouth/Throat: Oropharynx is clear and moist.  Eyes: Conjunctivae and EOM are normal. Pupils are equal, round, and reactive to light.  Neck: 50 % ROM. Neck supple. Surgical scar seen anteriorly.  Cardiovascular: Normal rate, regular rhythm and normal heart sounds.  Pulmonary/Chest: Effort normal and breath sounds normal. He has no wheezes. He has no rales.  Abdominal: Soft. Bowel sounds are normal. He exhibits no distension. There is no tenderness. There is no rebound.  Musculoskeletal: L palm with puncture wound with no obvious cellulitis. Dressing in place.  Neurological: He is alert and oriented to person, place, and time. No cranial nerve deficit. Coordination normal.  Skin: Skin is warm and dry.  Psychiatric: He has a normal mood and affect. His behavior is normal. Judgment and thought content normal   Disposition: 01-Home or Self Care     Medication List         clindamycin 300 MG capsule  Commonly known as:  CLEOCIN  Take 1 capsule (300 mg total) by mouth every 6 (six) hours.     traMADol 50 MG tablet  Commonly known as:  ULTRAM  Take 1 tablet (50 mg total) by mouth 2 (two) times daily.  Follow-up Information   Follow up with [facility] A, MD. Schedule an appointment as soon as possible for a visit in 4 days.   Specialty:  Orthopedic Surgery   Contact information:   [address] [place] [address] [phone]       Follow up with [facility] S, MD. Schedule an appointment as soon as possible for a visit in 2 months.    Specialty:  Cardiology   Contact information:   [address] [place] [address] [phone]       Signed: Ricki Rodriguez 02/07/2014, 7:43 PM

## 2014-02-07 NOTE — Progress Notes (Signed)
INITIAL NUTRITION ASSESSMENT  DOCUMENTATION CODES Per approved criteria  -Severe  malnutrition in the context of social or environmental circumstances   INTERVENTION:  Ensure Complete PO TID, each supplement provides 350 kcal and 13 grams of protein  NUTRITION DIAGNOSIS: Malnutrition related to limited access to food as evidenced by severe depletion of muscle and subcutaneous fat mass.   Goal: Intake to meet >90% of estimated nutrition needs.  Monitor:  PO intake, labs, weight trend.  Reason for Assessment: MST  53 y.o. male  Admitting Dx: Syncope and chest pain  ASSESSMENT: 53 y.o. male hx of COPD, chronic back pain here with dizziness and near syncope.  Patient reports that this was his 2nd fall within the past week. [place]. Prior to most recent move, he was essentially homeless. Limited food access, only a large cooler in his current home. No pots or pans until recently. Therefore, he has been eating very poorly, only 1 meal and 1 snack per day for multiple months. He is hungry and wants to try Ensure supplements. He weighed 160 lb last November.   Nutrition Focused Physical Exam:  Subcutaneous Fat:  Orbital Region: mild depletion Upper Arm Region: severe depletion Thoracic and Lumbar Region: NA  Muscle:  Temple Region: moderate depletion Clavicle Bone Region: severe depletion Clavicle and Acromion Bone Region: severe depletion Scapular Bone Region: NA Dorsal Hand: mild depletion Patellar Region: mild depletion Anterior Thigh Region: mild depletion Posterior Calf Region: mild depletion  Edema: None  Pt meets criteria for severe MALNUTRITION in the context of social or environmental concerns as evidenced by severe depletion of muscle and subcutaneous fat mass.  Height: Ht Readings from Last 1 Encounters:  02/06/14 5\' 9"  (1.753 m)    Weight: Wt Readings from Last 1 Encounters:  02/07/14 135 lb 5.8 oz (61.4 kg)    Ideal Body  Weight: 72.7 kg  % Ideal Body Weight: 84%  Wt Readings from Last 10 Encounters:  02/07/14 135 lb 5.8 oz (61.4 kg)  01/31/14 140 lb (63.504 kg)    Usual Body Weight: 160 lb  % Usual Body Weight: 84%  BMI:  Body mass index is 19.98 kg/(m^2).  Estimated Nutritional Needs: Kcal: 2000-2200 Protein: 100-110 gm Fluid: >/= 2 L  Skin: puncture wound to left hand  Diet Order: Cardiac  EDUCATION NEEDS: -Education needs addressed  No intake or output data in the 24 hours ending 02/07/14 1352  Last BM: 8/5   Labs:   Recent Labs Lab 02/06/14 1510 02/07/14 0629  NA 143 140  K 4.4 5.2  CL 106 104  CO2 23 29  BUN 9 9  CREATININE 0.69 0.82  CALCIUM 9.2 8.8  GLUCOSE 86 85    CBG (last 3)  No results found for this basename: GLUCAP,  in the last 72 hours  Scheduled Meds: . clindamycin  300 mg Oral 4 times per day  . heparin  5,000 Units Subcutaneous 3 times per day  . regadenoson      . sodium chloride  3 mL Intravenous Q12H    Continuous Infusions:   Past Medical History  Diagnosis Date  . COPD (chronic obstructive pulmonary disease)   . Arthritis   . Herniated disc     L6  . Hepatitis C     Past Surgical History  Procedure Laterality Date  . Cervical fusion      c3, c4, c5, c6    Joaquin CourtsKimberly Julie-Ann Vanmaanen, RD, LDN, CNSC Pager [phone] After  Hours Pager [phone]

## 2014-02-12 ENCOUNTER — Ambulatory Visit: Payer: Self-pay | Admitting: Internal Medicine

## 2014-02-17 ENCOUNTER — Emergency Department (HOSPITAL_COMMUNITY): Payer: Self-pay

## 2014-02-17 ENCOUNTER — Encounter (HOSPITAL_COMMUNITY): Payer: Self-pay | Admitting: Emergency Medicine

## 2014-02-17 ENCOUNTER — Emergency Department (HOSPITAL_COMMUNITY)
Admission: EM | Admit: 2014-02-17 | Discharge: 2014-02-17 | Disposition: A | Discharge status: Home / Self Care | Payer: Self-pay | Attending: Emergency Medicine | Admitting: Emergency Medicine

## 2014-02-17 DIAGNOSIS — F17200 Nicotine dependence, unspecified, uncomplicated: Secondary | ICD-10-CM | POA: Insufficient documentation

## 2014-02-17 DIAGNOSIS — Y939 Activity, unspecified: Secondary | ICD-10-CM | POA: Insufficient documentation

## 2014-02-17 DIAGNOSIS — R55 Syncope and collapse: Secondary | ICD-10-CM | POA: Insufficient documentation

## 2014-02-17 DIAGNOSIS — J4489 Other specified chronic obstructive pulmonary disease: Secondary | ICD-10-CM | POA: Insufficient documentation

## 2014-02-17 DIAGNOSIS — Z792 Long term (current) use of antibiotics: Secondary | ICD-10-CM | POA: Insufficient documentation

## 2014-02-17 DIAGNOSIS — S199XXA Unspecified injury of neck, initial encounter: Secondary | ICD-10-CM

## 2014-02-17 DIAGNOSIS — Y929 Unspecified place or not applicable: Secondary | ICD-10-CM | POA: Insufficient documentation

## 2014-02-17 DIAGNOSIS — Z8619 Personal history of other infectious and parasitic diseases: Secondary | ICD-10-CM | POA: Insufficient documentation

## 2014-02-17 DIAGNOSIS — Z8739 Personal history of other diseases of the musculoskeletal system and connective tissue: Secondary | ICD-10-CM | POA: Insufficient documentation

## 2014-02-17 DIAGNOSIS — J449 Chronic obstructive pulmonary disease, unspecified: Secondary | ICD-10-CM | POA: Insufficient documentation

## 2014-02-17 DIAGNOSIS — W19XXXA Unspecified fall, initial encounter: Secondary | ICD-10-CM

## 2014-02-17 DIAGNOSIS — IMO0002 Reserved for concepts with insufficient information to code with codable children: Secondary | ICD-10-CM | POA: Insufficient documentation

## 2014-02-17 DIAGNOSIS — S0993XA Unspecified injury of face, initial encounter: Secondary | ICD-10-CM | POA: Insufficient documentation

## 2014-02-17 DIAGNOSIS — R296 Repeated falls: Secondary | ICD-10-CM | POA: Insufficient documentation

## 2014-02-17 LAB — CBC WITH DIFFERENTIAL/PLATELET
BASOS ABS: 0 10*3/uL (ref 0.0–0.1)
Basophils Relative: 1 % (ref 0–1)
Eosinophils Absolute: 0.1 10*3/uL (ref 0.0–0.7)
Eosinophils Relative: 4 % (ref 0–5)
HEMATOCRIT: 41.3 % (ref 39.0–52.0)
Hemoglobin: 13.9 g/dL (ref 13.0–17.0)
LYMPHS ABS: 1.5 10*3/uL (ref 0.7–4.0)
LYMPHS PCT: 45 % (ref 12–46)
MCH: 31.9 pg (ref 26.0–34.0)
MCHC: 33.7 g/dL (ref 30.0–36.0)
MCV: 94.7 fL (ref 78.0–100.0)
Monocytes Absolute: 0.5 10*3/uL (ref 0.1–1.0)
Monocytes Relative: 15 % — ABNORMAL HIGH (ref 3–12)
Neutro Abs: 1.2 10*3/uL — ABNORMAL LOW (ref 1.7–7.7)
Neutrophils Relative %: 35 % — ABNORMAL LOW (ref 43–77)
PLATELETS: 269 10*3/uL (ref 150–400)
RBC: 4.36 MIL/uL (ref 4.22–5.81)
RDW: 13.2 % (ref 11.5–15.5)
WBC: 3.4 10*3/uL — AB (ref 4.0–10.5)

## 2014-02-17 LAB — BASIC METABOLIC PANEL
ANION GAP: 16 — AB (ref 5–15)
BUN: 14 mg/dL (ref 6–23)
CHLORIDE: 103 meq/L (ref 96–112)
CO2: 20 meq/L (ref 19–32)
Calcium: 9.2 mg/dL (ref 8.4–10.5)
Creatinine, Ser: 0.7 mg/dL (ref 0.50–1.35)
GFR calc Af Amer: >90 mL/min (ref >90)
GFR calc non Af Amer: >90 mL/min (ref >90)
Glucose, Bld: 62 mg/dL — ABNORMAL LOW (ref 70–99)
Potassium: 4.8 mEq/L (ref 3.7–5.3)
Sodium: 139 mEq/L (ref 137–147)

## 2014-02-17 MED ORDER — OXYCODONE-ACETAMINOPHEN 5-325 MG PO TABS
1.0000 | ORAL_TABLET | Freq: Once | ORAL | Status: AC
  Administered 2014-02-17: 1 via ORAL
  Filled 2014-02-17: qty 1

## 2014-02-17 NOTE — ED Notes (Signed)
Patient transported to X-ray 

## 2014-02-17 NOTE — Discharge Instructions (Signed)
Cardiac Event Monitoring °A cardiac event monitor is a small recording device used to help detect abnormal heart rhythms (arrhythmias). The monitor is used to record heart rhythm when noticeable symptoms such as the following occur: °· Fast heartbeats (palpitations), such as heart racing or fluttering. °· Dizziness. °· Fainting or light-headedness. °· Unexplained weakness. °The monitor is wired to two electrodes placed on your chest. Electrodes are flat, sticky disks that attach to your skin. The monitor can be worn for up to 30 days. You will wear the monitor at all times, except when bathing.  °HOW TO USE YOUR CARDIAC EVENT MONITOR °A technician will prepare your chest for the electrode placement. The technician will show you how to place the electrodes, how to work the monitor, and how to replace the batteries. Take time to practice using the monitor before you leave the office. Make sure you understand how to send the information from the monitor to your health care provider. This requires a telephone with a landline, not a cell phone. You need to: °· Wear your monitor at all times, except when you are in water: °· Do not get the monitor wet. °· Take the monitor off when bathing. Do not swim or use a hot tub with it on. °· Keep your skin clean. Do not put body lotion or moisturizer on your chest. °· Change the electrodes daily or any time they stop sticking to your skin. You might need to use tape to keep them on. °· It is possible that your skin under the electrodes could become irritated. To keep this from happening, try to put the electrodes in slightly different places on your chest. However, they must remain in the area under your left breast and in the upper right section of your chest. °· Make sure the monitor is safely clipped to your clothing or in a location close to your body that your health care provider recommends. °· Press the button to record when you feel symptoms of heart trouble, such as  dizziness, weakness, light-headedness, palpitations, thumping, shortness of breath, unexplained weakness, or a fluttering or racing heart. The monitor is always on and records what happened slightly before you pressed the button, so do not worry about being too late to get good information. °· Keep a diary of your activities, such as walking, doing chores, and taking medicine. It is especially important to note what you were doing when you pushed the button to record your symptoms. This will help your health care provider determine what might be contributing to your symptoms. The information stored in your monitor will be reviewed by your health care provider alongside your diary entries. °· Send the recorded information as recommended by your health care provider. It is important to understand that it will take some time for your health care provider to process the results. °· Change the batteries as recommended by your health care provider. °SEEK IMMEDIATE MEDICAL CARE IF:  °· You have chest pain. °· You have extreme difficulty breathing or shortness of breath. °· You develop a very fast heartbeat that persists. °· You develop dizziness that does not go away. °· You faint or constantly feel you are about to faint. °Document Released: 03/30/2008 Document Revised: 11/05/2013 Document Reviewed: 12/18/2012 °ExitCare® Patient Information ©2015 [employer]. This information is not intended to replace advice given to you by your health care provider. Make sure you discuss any questions you have with your health care provider. ° °Syncope °Syncope is a medical   for fainting or passing out. This means you lose consciousness and drop to the ground. People are generally unconscious for less than 5 minutes. You may have some muscle twitches for up to 15 seconds before waking up and returning to normal. Syncope occurs more often in older adults, but it can happen to anyone. While most causes of syncope are not dangerous,  syncope can be a sign of a serious medical problem. It is important to seek medical care.  CAUSES  Syncope is caused by a sudden drop in blood flow to the brain. The specific cause is often not determined. Factors that can bring on syncope include:  Taking medicines that lower blood pressure.  Sudden changes in posture, such as standing up quickly.  Taking more medicine than prescribed.  Standing in one place for too long.  Seizure disorders.  Dehydration and excessive exposure to heat.  Low blood sugar (hypoglycemia).  Straining to have a bowel movement.  Heart disease, irregular heartbeat, or other circulatory problems.  Fear, emotional distress, seeing blood, or severe pain. SYMPTOMS  Right before fainting, you may:  Feel dizzy or light-headed.  Feel nauseous.  See all white or all black in your field of vision.  Have cold, clammy skin. DIAGNOSIS  Your health care provider will ask about your symptoms, perform a physical exam, and perform an electrocardiogram (ECG) to record the electrical activity of your heart. Your health care provider may also perform other heart or blood tests to determine the cause of your syncope which may include:  Transthoracic echocardiogram (TTE). During echocardiography, sound waves are used to evaluate how blood flows through your heart.  Transesophageal echocardiogram (TEE).  Cardiac monitoring. This allows your health care provider to monitor your heart rate and rhythm in real time.  Holter monitor. This is a portable device that records your heartbeat and can help diagnose heart arrhythmias. It allows your health care provider to track your heart activity for several days, if needed.  Stress tests by exercise or by giving medicine that makes the heart beat faster. TREATMENT  In most cases, no treatment is needed. Depending on the cause of your syncope, your health care provider may recommend changing or stopping some of your  medicines. HOME CARE INSTRUCTIONS  Have someone stay with you until you feel stable.  Do not drive, use machinery, or play sports until your health care provider says it is okay.  Keep all follow-up appointments as directed by your health care provider.  Lie down right away if you start feeling like you might faint. Breathe deeply and steadily. Wait until all the symptoms have passed.  Drink enough fluids to keep your urine clear or pale yellow.  If you are taking blood pressure or heart medicine, get up slowly and take several minutes to sit and then stand. This can reduce dizziness. SEEK IMMEDIATE MEDICAL CARE IF:   You have a severe headache.  You have unusual pain in the chest, abdomen, or back.  You are bleeding from your mouth or rectum, or you have black or tarry stool.  You have an irregular or very fast heartbeat.  You have pain with breathing.  You have repeated fainting or seizure-like jerking during an episode.  You faint when sitting or lying down.  You have confusion.  You have trouble walking.  You have severe weakness.  You have vision problems. If you fainted, call your local emergency services (911 in [place] Do not drive yourself to the hospital.  MAKE SURE YOU:  Understand these instructions.  Will watch your condition.  Will get help right away if you are not doing well or get worse. Document Released: 06/21/2005 Document Revised: 06/26/2013 Document Reviewed: 08/20/2011 [facility] Patient Information 2015 [employer], [place]. This information is not intended to replace advice given to you by your health care provider. Make sure you discuss any questions you have with your health care provider.

## 2014-02-17 NOTE — ED Provider Notes (Signed)
CSN: [id number]     Arrival date & time 02/17/14  2034 History   First MD Initiated Contact with Patient 02/17/14 2042     Chief Complaint  Patient presents with  . Near Syncope     (Consider location/radiation/quality/duration/timing/severity/associated sxs/prior Treatment) Patient is a 53 y.o. male presenting with fall.  Fall This is a new problem. The current episode started 1 to 2 hours ago. The problem occurs every several days. The problem has not changed since onset.Pertinent negatives include no chest pain, no abdominal pain, no headaches and no shortness of breath. Nothing aggravates the symptoms. Nothing relieves the symptoms. He has tried nothing for the symptoms.    Past Medical History  Diagnosis Date  . COPD (chronic obstructive pulmonary disease)   . Arthritis   . Herniated disc     L6  . Hepatitis C    Past Surgical History  Procedure Laterality Date  . Cervical fusion      c3, c4, c5, c6   No family history on file. History  Substance Use Topics  . Smoking status: Current Every Day Smoker    Types: Cigarettes  . Smokeless tobacco: Not on file  . Alcohol Use: Yes     Comment: beer a night    Review of Systems  Constitutional: Negative for fever and chills.  HENT: Negative for congestion, rhinorrhea and sore throat.   Eyes: Negative for photophobia and visual disturbance.  Respiratory: Negative for cough and shortness of breath.   Cardiovascular: Negative for chest pain and leg swelling.  Gastrointestinal: Negative for nausea, vomiting, abdominal pain, diarrhea and constipation.  Endocrine: Negative for polydipsia and polyuria.  Genitourinary: Negative for dysuria and hematuria.  Musculoskeletal: Negative for arthralgias and back pain.  Skin: Negative for color change and rash.  Neurological: Negative for dizziness, syncope (near syncope), light-headedness and headaches.  Hematological: Negative for adenopathy. Does not bruise/bleed easily.  All other  systems reviewed and are negative.     Allergies  Naproxen  Home Medications   Prior to Admission medications   Medication Sig Start Date End Date Taking? Authorizing Provider  clindamycin (CLEOCIN) 300 MG capsule Take 300 mg by mouth every 6 (six) hours. Started medication on 02-08-14 patient states he stop taking it for a minute then started back on it couple days ago from today (02-17-14) 02/07/14  Yes Ricki Rodriguez, MD  traMADol (ULTRAM) 50 MG tablet Take 50 mg by mouth every 6 (six) hours as needed for moderate pain.   Yes Historical Provider, MD   BP 127/82  Pulse 74  Temp(Src) 98.1 F (36.7 C) (Oral)  Resp 21  SpO2 99% Physical Exam  Vitals reviewed. Constitutional: He is oriented to person, place, and time. He appears well-developed and well-nourished.  HENT:  Head: Normocephalic and atraumatic.  Eyes: Conjunctivae and EOM are normal.  Neck: Normal range of motion. Neck supple.  Cardiovascular: Normal rate, regular rhythm and normal heart sounds.   Pulmonary/Chest: Effort normal and breath sounds normal. No respiratory distress.  Abdominal: He exhibits no distension. There is no tenderness. There is no rebound and no guarding.  Musculoskeletal: Normal range of motion.       Cervical back: He exhibits tenderness and bony tenderness.       Thoracic back: Normal.       Lumbar back: He exhibits tenderness and bony tenderness.  Neurological: He is alert and oriented to person, place, and time. He has normal strength and normal reflexes. No cranial nerve  deficit or sensory deficit. Gait normal.  Skin: Skin is warm and dry.    ED Course  Procedures (including critical care time) Labs Review Labs Reviewed  CBC WITH DIFFERENTIAL - Abnormal; Notable for the following:    WBC 3.4 (*)    Neutrophils Relative % 35 (*)    Neutro Abs 1.2 (*)    Monocytes Relative 15 (*)    All other components within normal limits  BASIC METABOLIC PANEL - Abnormal; Notable for the following:     Glucose, Bld 62 (*)    Anion gap 16 (*)    All other components within normal limits    Imaging Review Dg Chest 2 View  02/17/2014   CLINICAL DATA:  Syncopal episode.  EXAM: CHEST  2 VIEW  COMPARISON:  None.  FINDINGS: The cardiac silhouette, mediastinal and hilar contours are within normal limits. There is mild tortuosity and calcification of the thoracic aorta. The lungs are clear. No pleural effusion. The bony thorax is intact.  IMPRESSION: No acute cardiopulmonary findings.   Electronically Signed   By: Loralie ChampagneMark  Gallerani M.D.   On: 02/17/2014 22:28   Dg Cervical Spine Complete  02/17/2014   CLINICAL DATA:  Larey SeatFell.  Neck pain.  History of prior surgery.  EXAM: CERVICAL SPINE  4+ VIEWS  COMPARISON:  CT scan 01/31/2014.  FINDINGS: Stable anterior and posterior fusion hardware extending from C3 is C6. No complicating features are demonstrated. The cervical alignment is normal. No acute fracture or abnormal prevertebral soft tissue swelling. There are bridging anterior osteophytes at C6-7. The C1-2 articulations are maintained. The lung apices are clear.  IMPRESSION: Stable cervical fusion hardware without complicating features.  No acute fracture.   Electronically Signed   By: Loralie ChampagneMark  Gallerani M.D.   On: 02/17/2014 22:30   Dg Thoracic Spine 2 View  02/17/2014   CLINICAL DATA:  Larey SeatFell.  Back pain.  EXAM: THORACIC SPINE - 2 VIEW  COMPARISON:  None.  FINDINGS: Moderate degenerative changes involving the mid and lower thoracic spine with mild compression deformities of uncertain age.  IMPRESSION: Multiple lower thoracic compression deformities of uncertain age.   Electronically Signed   By: Loralie ChampagneMark  Gallerani M.D.   On: 02/17/2014 22:33   Dg Lumbar Spine Complete  02/17/2014   CLINICAL DATA:  Near syncope  EXAM: LUMBAR SPINE - COMPLETE 4+ VIEW  COMPARISON:  01/31/2014  FINDINGS: No evidence of acute fracture or traumatic subluxation. There is chronic L3-4 retrolisthesis, accentuated by rotation. Diffuse  degenerative disc narrowing and spondylotic endplate spurring, with disc narrowing maximal at L2-3. Limbus vertebra present inferiorly at T11 and likely superiorly at L1. Question chronic pars defect on the left at L5.  IMPRESSION: 1. No acute osseous findings. 2. Chronic findings noted above.   Electronically Signed   By: Tiburcio PeaJonathan  Watts M.D.   On: 02/17/2014 22:31   Ct Head Wo Contrast  02/17/2014   CLINICAL DATA:  Near syncope.  EXAM: CT HEAD WITHOUT CONTRAST  TECHNIQUE: Contiguous axial images were obtained from the base of the skull through the vertex without intravenous contrast.  COMPARISON:  01/31/2014  FINDINGS: Skull and Sinuses:Mild inflammatory mucosal thickening in the anterior ethmoids and inferior frontal sinuses. Frothy secretions the left sphenoid sinus without mucosal thickening. No sinus effusion. Remote left zygomatic arch fracture. No acute fracture or destructive process.  Orbits: No acute abnormality.  Brain: No evidence of acute abnormality, such as acute infarction, hemorrhage, hydrocephalus, or mass lesion/mass effect.  IMPRESSION: Negative head CT.  Electronically Signed   By: Tiburcio Pea M.D.   On: 02/17/2014 22:28     EKG Interpretation   Date/Time:  Sunday February 17 2014 20:46:11 EDT Ventricular Rate:  87 PR Interval:  134 QRS Duration: 100 QT Interval:  379 QTC Calculation: 456 R Axis:   82 Text Interpretation:  Age not entered, assumed to be  53 years old for  purpose of ECG interpretation Sinus rhythm Right atrial enlargement  Abnormal R-wave progression, early transition Consider anterior infarct  Confirmed by Mirian Mo [phone]) on 02/17/2014 10:23:36 PM      MDM   Final diagnoses:  Near syncope  Fall, initial encounter    53 y.o. male  with pertinent PMH of chronic back pain, COPD, hep C presents with recurrent near-syncope and fall. Patient states he was walking to the mailbox, was near syncopal, and fell onto his back. Patient states your  murmurs entirety event and specifically denies loss of consciousness. He states he had antecedent symptoms of a rushing to his head.  This is identical to prior episodes for which he has been seen multiple times here with negative cardiac stress test and admission with unremarkable workup.  He denies seizure like activity, and has no family history of sudden death at an early age.  Physical exam unremarkable with exception of tenderness of cervical spine and lumbar spine, including neuro and cardiac exam.      Labs and imaging as above reviewed. No acute fractures.  CT head unremarkable.  Labs also unremarkable.  Pt had an episode here which did not demonstrate abnormality.  I examined him afterwards and he was without acute change or msk injury.  Unknown etiology of falls, however doubt cardiogenic syncope given recent negative cardiac wu, tele with event here unremarkable.  Similarly doubt neurogenic syncope given unremarkable wu and no concerning symptoms or physical exam findings.  Will have pt fu with cardiology and PCP.  DC home in stable condition.   1. Near syncope   2. Fall, initial encounter         Mirian Mo, MD 02/18/14 [phone]

## 2014-02-17 NOTE — ED Notes (Signed)
Pt called out on call bell for assistance. This RN responded immediately pt was found on floor and was helped back to bed. Pt reports that his head began to feel "weird" before the incident. MD notified and pt reevaluated for any new symptoms or complaints, none reported. Consulting civil engineerCharge RN notified.

## 2014-02-17 NOTE — ED Notes (Signed)
Per PT[facility], pt coming in today to be evaluated for a near syncopal episode. Pt was going to check the mail and began to feel dizzy and fell backwards. Pt denies LOC, CP and SOB. Pt also reports hx of the same. Pt reports back pain and headache at this time.

## 2014-02-18 ENCOUNTER — Encounter (HOSPITAL_COMMUNITY): Payer: Self-pay | Admitting: Emergency Medicine

## 2014-02-18 ENCOUNTER — Emergency Department (HOSPITAL_COMMUNITY)
Admission: EM | Admit: 2014-02-18 | Discharge: 2014-02-18 | Disposition: A | Discharge status: Home / Self Care | Payer: Self-pay | Attending: Emergency Medicine | Admitting: Emergency Medicine

## 2014-02-18 DIAGNOSIS — G8929 Other chronic pain: Secondary | ICD-10-CM | POA: Insufficient documentation

## 2014-02-18 DIAGNOSIS — Z792 Long term (current) use of antibiotics: Secondary | ICD-10-CM | POA: Insufficient documentation

## 2014-02-18 DIAGNOSIS — F17200 Nicotine dependence, unspecified, uncomplicated: Secondary | ICD-10-CM | POA: Insufficient documentation

## 2014-02-18 DIAGNOSIS — J4489 Other specified chronic obstructive pulmonary disease: Secondary | ICD-10-CM | POA: Insufficient documentation

## 2014-02-18 DIAGNOSIS — J449 Chronic obstructive pulmonary disease, unspecified: Secondary | ICD-10-CM | POA: Insufficient documentation

## 2014-02-18 DIAGNOSIS — R55 Syncope and collapse: Secondary | ICD-10-CM | POA: Insufficient documentation

## 2014-02-18 DIAGNOSIS — Z8739 Personal history of other diseases of the musculoskeletal system and connective tissue: Secondary | ICD-10-CM | POA: Insufficient documentation

## 2014-02-18 DIAGNOSIS — Z8619 Personal history of other infectious and parasitic diseases: Secondary | ICD-10-CM | POA: Insufficient documentation

## 2014-02-18 LAB — CBG MONITORING, ED: GLUCOSE-CAPILLARY: 86 mg/dL (ref 70–99)

## 2014-02-18 MED ORDER — OXYCODONE-ACETAMINOPHEN 5-325 MG PO TABS
1.0000 | ORAL_TABLET | Freq: Once | ORAL | Status: AC
  Administered 2014-02-18: 1 via ORAL
  Filled 2014-02-18: qty 1

## 2014-02-18 NOTE — ED Notes (Signed)
Pt reports lower back pain at this time. MD aware. VSS at the time of D/C.

## 2014-02-18 NOTE — ED Notes (Signed)
Pt was just discharge for syncope episode. While he was waiting for ride pt was sitting on wall outside and had another episode. sts he did not pass out but he got lightheaded and fell backwards, hitting head and back. Pt denies nv, cp, sob.

## 2014-02-18 NOTE — ED Notes (Signed)
Pt here s/p fall.  Pt sts he was outside smoking a cigarette when he felt the ground come out from underneath him.  Pt sts he fell backwards and hit his head.  -LOC.  Pt reporting new neck pain and previous back pain.  Nystagmus noted during assessment.  Pt reporting double vision.

## 2014-02-18 NOTE — Discharge Instructions (Signed)
Please followup with a primary care provider for continued evaluation and treatment of your symptoms.    Near-Syncope Near-syncope (commonly known as near fainting) is sudden weakness, dizziness, or feeling like you might pass out. This can happen when getting up or while standing for a long time. It is caused by a sudden decrease in blood flow to the brain, which can occur for various reasons. Most of the reasons are not serious.  HOME CARE Watch your condition for any changes.  Have someone stay with you until you feel stable.  If you feel like you are going to pass out:  Lie down right away.  Prop your feet up if you can.  Breathe deeply and steadily.  Move only when the feeling has gone away. Most of the time, this feeling lasts only a few minutes. You may feel tired for several hours.  Drink enough fluids to keep your pee (urine) clear or pale yellow.  If you are taking blood pressure or heart medicine, stand up slowly.  Follow up with your doctor as told. GET HELP RIGHT AWAY IF:   You have a severe headache.  You have unusual pain in the chest, belly (abdomen), or back.  You have bleeding from the mouth or butt (rectum), or you have black or tarry poop (stool).  You feel your heart beat differently than normal, or you have a very fast pulse.  You pass out, or you twitch and shake when you pass out.  You pass out when sitting or lying down.  You feel confused.  You have trouble walking.  You are weak.  You have vision problems. MAKE SURE YOU:   Understand these instructions.  Will watch your condition.  Will get help right away if you are not doing well or get worse. Document Released: 12/08/2007 Document Revised: 06/26/2013 Document Reviewed: 11/24/2012 [facility] Patient Information 2015 [place], [place]. This information is not intended to replace advice given to you by your health care provider. Make sure you discuss any questions you have with your health  care provider.   Chronic Pain Chronic pain can be defined as pain that is off and on and lasts for 3-6 months or longer. Many things cause chronic pain, which can make it difficult to make a diagnosis. There are many treatment options available for chronic pain. However, finding a treatment that works well for you may require trying various approaches until the right one is found. Many people benefit from a combination of two or more types of treatment to control their pain. SYMPTOMS  Chronic pain can occur anywhere in the body and can range from mild to very severe. Some types of chronic pain include:  Headache.  Low back pain.  Cancer pain.  Arthritis pain.  Neurogenic pain. This is pain resulting from damage to nerves. People with chronic pain may also have other symptoms such as:  Depression.  Anger.  Insomnia.  Anxiety. DIAGNOSIS  Your health care provider will help diagnose your condition over time. In many cases, the initial focus will be on excluding possible conditions that could be causing the pain. Depending on your symptoms, your health care provider may order tests to diagnose your condition. Some of these tests may include:   Blood tests.   CT scan.   MRI.   X-rays.   Ultrasounds.   Nerve conduction studies.  You may need to see a specialist.  TREATMENT  Finding treatment that works well may take time. You may be  referred to a pain specialist. He or she may prescribe medicine or therapies, such as:   Mindful meditation or yoga.  Shots (injections) of numbing or pain-relieving medicines into the spine or area of pain.  Local electrical stimulation.  Acupuncture.   Massage therapy.   Aroma, color, light, or sound therapy.   Biofeedback.   Working with a physical therapist to keep from getting stiff.   Regular, gentle exercise.   Cognitive or behavioral therapy.   Group support.  Sometimes, surgery may be recommended.  HOME  CARE INSTRUCTIONS   Take all medicines as directed by your health care provider.   Lessen stress in your life by relaxing and doing things such as listening to calming music.   Exercise or be active as directed by your health care provider.   Eat a healthy diet and include things such as vegetables, fruits, fish, and lean meats in your diet.   Keep all follow-up appointments with your health care provider.   Attend a support group with others suffering from chronic pain. SEEK MEDICAL CARE IF:   Your pain gets worse.   You develop a new pain that was not there before.   You cannot tolerate medicines given to you by your health care provider.   You have new symptoms since your last visit with your health care provider.  SEEK IMMEDIATE MEDICAL CARE IF:   You feel weak.   You have decreased sensation or numbness.   You lose control of bowel or bladder function.   Your pain suddenly gets much worse.   You develop shaking.  You develop chills.  You develop confusion.  You develop chest pain.  You develop shortness of breath.  MAKE SURE YOU:  Understand these instructions.  Will watch your condition.  Will get help right away if you are not doing well or get worse. Document Released: 03/13/2002 Document Revised: 02/21/2013 Document Reviewed: 12/15/2012 [facility] Patient Information 2015 [place]. This information is not intended to replace advice given to you by your health care provider. Make sure you discuss any questions you have with your health care provider.

## 2014-02-18 NOTE — ED Provider Notes (Signed)
CSN: [id number]     Arrival date & time 02/18/14  0035 History   First MD Initiated Contact with Patient 02/18/14 0159     Chief Complaint  Patient presents with  . Near Syncope   HPI  History provided by the patient and recent medical chart. Patient is a 53 year old male who recently relocated from [place] to [place] with past history of cervical spine fusion, hepatitis C and COPD who presents with complaints of lightheadedness and near syncope. Patient was just evaluated for similar symptoms of a syncopal episode and fall backwards. He had CT scan of head and neck as well as back x-rays without signs of acute injury or fracture. He was treated with Percocet and discharged. Patient states that he was sitting and leaning against a wall outside of the hospital when he became slightly lightheaded and fell over backwards to the ground. He reports hitting the back of his head. Denies any LOC. He complains of pain to his head and neck. Denies any weakness or numbness in extremities. Denies any chest pain or shortness of breath. No palpitations.    Past Medical History  Diagnosis Date  . COPD (chronic obstructive pulmonary disease)   . Arthritis   . Herniated disc     L6  . Hepatitis C    Past Surgical History  Procedure Laterality Date  . Cervical fusion      c3, c4, c5, c6   No family history on file. History  Substance Use Topics  . Smoking status: Current Every Day Smoker    Types: Cigarettes  . Smokeless tobacco: Not on file  . Alcohol Use: Yes     Comment: beer a night    Review of Systems  Neurological: Negative for syncope, weakness and numbness.  All other systems reviewed and are negative.     Allergies  Naproxen  Home Medications   Prior to Admission medications   Medication Sig Start Date End Date Taking? Authorizing Provider  clindamycin (CLEOCIN) 300 MG capsule Take 300 mg by mouth 3 (three) times daily. 02/08/14  Yes Historical Provider, MD  traMADol  (ULTRAM) 50 MG tablet Take 50 mg by mouth every 6 (six) hours as needed for moderate pain.   Yes Historical Provider, MD   BP 134/91  Pulse 75  Temp(Src) 97.9 F (36.6 C)  Resp 15  Ht 5\' 9"  (1.753 m)  Wt 140 lb (63.504 kg)  BMI 20.67 kg/m2  SpO2 97% Physical Exam  Nursing note and vitals reviewed. Constitutional: He is oriented to person, place, and time. He appears well-developed and well-nourished. No distress.  HENT:  Head: Normocephalic and atraumatic.  Eyes: Conjunctivae and EOM are normal. Pupils are equal, round, and reactive to light.  Neck: Normal range of motion. Neck supple.  Old surgical scar to the posterior neck and cervical spine consistent with history of surgery. There is no crepitus or step-offs. No significant pain during palpation. Full range of motion.  Cardiovascular: Normal rate and regular rhythm.   Pulmonary/Chest: Effort normal and breath sounds normal. No respiratory distress. He has no wheezes. He has no rales.  Abdominal: Soft.  Musculoskeletal: Normal range of motion.  Neurological: He is alert and oriented to person, place, and time.  Normal and equal strength in all extremities. Normal sensations.  Skin: Skin is warm.  Psychiatric: He has a normal mood and affect.    ED Course  Procedures   COORDINATION OF CARE:  Nursing notes reviewed. Vital signs reviewed. Initial  pt interview and examination performed.   Filed Vitals:   02/18/14 0039 02/18/14 0118 02/18/14 0130  BP: 148/106 122/82 134/91  Pulse: 89 72 75  Temp: 97.9 F (36.6 C)    Resp: 18 12 15   Height: 5\' 9"  (1.753 m)    Weight: 140 lb (63.504 kg)    SpO2: 100% 97% 97%    2:16 AM-patient seen and evaluated. Patient appears well does not appear in severe pain or discomfort. There are no significant signs of any head injury or other injuries from falling that he reports. Patient recently relocated to [place] from [place] several weeks ago. Since that time he has had 3 visits  to the emergency room including an admission for similar complaints. He had a negative stress test. He was evaluated with complete imaging of his head and spine just prior to his reported new fall.   I offered to give him one Percocet for his pain however he stated he did not wish to have this and wished to have IV pain medication because his pain was very severe. Clinically I have very low suspicion for any fracture or concerning injury with his multiple visits I'm suspicious that he is having more chronic pain and acute pain. He has unremarkable vital signs. Blood sugar was slightly low earlier however is normal now. No orthostatic hypotension. I feel he has been adequately screened in the emergency department and may be discharged to followup with her primary care provider.     Treatment plan initiated: Medications  oxyCODONE-acetaminophen (PERCOCET/ROXICET) 5-325 MG per tablet 1 tablet (not administered)      Imaging Review Dg Chest 2 View  02/17/2014   CLINICAL DATA:  Syncopal episode.  EXAM: CHEST  2 VIEW  COMPARISON:  None.  FINDINGS: The cardiac silhouette, mediastinal and hilar contours are within normal limits. There is mild tortuosity and calcification of the thoracic aorta. The lungs are clear. No pleural effusion. The bony thorax is intact.  IMPRESSION: No acute cardiopulmonary findings.   Electronically Signed   By: Loralie Champagne M.D.   On: 02/17/2014 22:28   Dg Cervical Spine Complete  02/17/2014   CLINICAL DATA:  Larey Seat.  Neck pain.  History of prior surgery.  EXAM: CERVICAL SPINE  4+ VIEWS  COMPARISON:  CT scan 01/31/2014.  FINDINGS: Stable anterior and posterior fusion hardware extending from C3 is C6. No complicating features are demonstrated. The cervical alignment is normal. No acute fracture or abnormal prevertebral soft tissue swelling. There are bridging anterior osteophytes at C6-7. The C1-2 articulations are maintained. The lung apices are clear.  IMPRESSION: Stable  cervical fusion hardware without complicating features.  No acute fracture.   Electronically Signed   By: Loralie Champagne M.D.   On: 02/17/2014 22:30   Dg Thoracic Spine 2 View  02/17/2014   CLINICAL DATA:  Larey Seat.  Back pain.  EXAM: THORACIC SPINE - 2 VIEW  COMPARISON:  None.  FINDINGS: Moderate degenerative changes involving the mid and lower thoracic spine with mild compression deformities of uncertain age.  IMPRESSION: Multiple lower thoracic compression deformities of uncertain age.   Electronically Signed   By: Loralie Champagne M.D.   On: 02/17/2014 22:33   Dg Lumbar Spine Complete  02/17/2014   CLINICAL DATA:  Near syncope  EXAM: LUMBAR SPINE - COMPLETE 4+ VIEW  COMPARISON:  01/31/2014  FINDINGS: No evidence of acute fracture or traumatic subluxation. There is chronic L3-4 retrolisthesis, accentuated by rotation. Diffuse degenerative disc narrowing and spondylotic endplate  spurring, with disc narrowing maximal at L2-3. Limbus vertebra present inferiorly at T11 and likely superiorly at L1. Question chronic pars defect on the left at L5.  IMPRESSION: 1. No acute osseous findings. 2. Chronic findings noted above.   Electronically Signed   By: Tiburcio PeaJonathan  Watts M.D.   On: 02/17/2014 22:31   Ct Head Wo Contrast  02/17/2014   CLINICAL DATA:  Near syncope.  EXAM: CT HEAD WITHOUT CONTRAST  TECHNIQUE: Contiguous axial images were obtained from the base of the skull through the vertex without intravenous contrast.  COMPARISON:  01/31/2014  FINDINGS: Skull and Sinuses:Mild inflammatory mucosal thickening in the anterior ethmoids and inferior frontal sinuses. Frothy secretions the left sphenoid sinus without mucosal thickening. No sinus effusion. Remote left zygomatic arch fracture. No acute fracture or destructive process.  Orbits: No acute abnormality.  Brain: No evidence of acute abnormality, such as acute infarction, hemorrhage, hydrocephalus, or mass lesion/mass effect.  IMPRESSION: Negative head CT.    Electronically Signed   By: Tiburcio PeaJonathan  Watts M.D.   On: 02/17/2014 22:28     MDM   Final diagnoses:  Near syncope  Chronic pain        Angus Sellereter S Naven Giambalvo, PA-C 02/18/14 [phone]

## 2014-02-19 ENCOUNTER — Emergency Department (HOSPITAL_COMMUNITY)
Admission: EM | Admit: 2014-02-19 | Discharge: 2014-02-19 | Disposition: A | Discharge status: Home / Self Care | Payer: Self-pay | Attending: Emergency Medicine | Admitting: Emergency Medicine

## 2014-02-19 ENCOUNTER — Emergency Department (HOSPITAL_COMMUNITY): Payer: Self-pay

## 2014-02-19 ENCOUNTER — Encounter (HOSPITAL_COMMUNITY): Payer: Self-pay | Admitting: Emergency Medicine

## 2014-02-19 DIAGNOSIS — Y9289 Other specified places as the place of occurrence of the external cause: Secondary | ICD-10-CM | POA: Insufficient documentation

## 2014-02-19 DIAGNOSIS — M542 Cervicalgia: Secondary | ICD-10-CM

## 2014-02-19 DIAGNOSIS — S199XXA Unspecified injury of neck, initial encounter: Principal | ICD-10-CM

## 2014-02-19 DIAGNOSIS — Y9389 Activity, other specified: Secondary | ICD-10-CM | POA: Insufficient documentation

## 2014-02-19 DIAGNOSIS — S0993XA Unspecified injury of face, initial encounter: Secondary | ICD-10-CM | POA: Insufficient documentation

## 2014-02-19 DIAGNOSIS — J449 Chronic obstructive pulmonary disease, unspecified: Secondary | ICD-10-CM | POA: Insufficient documentation

## 2014-02-19 DIAGNOSIS — Z792 Long term (current) use of antibiotics: Secondary | ICD-10-CM | POA: Insufficient documentation

## 2014-02-19 DIAGNOSIS — R296 Repeated falls: Secondary | ICD-10-CM | POA: Insufficient documentation

## 2014-02-19 DIAGNOSIS — Z9889 Other specified postprocedural states: Secondary | ICD-10-CM

## 2014-02-19 DIAGNOSIS — F17200 Nicotine dependence, unspecified, uncomplicated: Secondary | ICD-10-CM | POA: Insufficient documentation

## 2014-02-19 DIAGNOSIS — J4489 Other specified chronic obstructive pulmonary disease: Secondary | ICD-10-CM | POA: Insufficient documentation

## 2014-02-19 DIAGNOSIS — Z8739 Personal history of other diseases of the musculoskeletal system and connective tissue: Secondary | ICD-10-CM | POA: Insufficient documentation

## 2014-02-19 DIAGNOSIS — Z8619 Personal history of other infectious and parasitic diseases: Secondary | ICD-10-CM | POA: Insufficient documentation

## 2014-02-19 MED ORDER — TRAMADOL HCL 50 MG PO TABS: 50.0000 mg | ORAL_TABLET | Freq: Four times a day (QID) | ORAL | Status: DC | PRN

## 2014-02-19 NOTE — ED Notes (Addendum)
Pt states he fell backwards 3 night ago. Pt reports his neck is hurting badly. States heard something crack. Pt awake, alert, oriented x4, NAD at present.

## 2014-02-19 NOTE — ED Provider Notes (Signed)
CSN: [id number]     Arrival date & time 02/19/14  1122 History  This chart was scribed for non-physician practitioner, Mellody DrownLauren Vence Lalor, PA-C working with Rolland PorterMark James, MD by Greggory StallionKayla Andersen, ED scribe. This patient was seen in room TR10C/TR10C and the patient's care was started at 12:39 PM.   Chief Complaint  Patient presents with  . Neck Pain   HPI Comments: Ryan Knox is a 53 y.o. male with history of chronic back pain who presents to the Emergency Department complaining of continued neck pain that started 2 days ago after falling backwards. Reports he heard something crack. He has associated intermittent numbness and tingling his in fingers, chronic unchanged from previous. Pt was evaluated late last night for the same. Neck and back xrays and head CT were done on 02/17/14 and were all negative, prior to fall. Pt was discharged home but states he fell outside and hit his head while he was waiting on his ride. Denies LOC. He has been wearing a C-collar. Denies new injury since being discharged. States he has noticed some swelling to the front of his neck. [facility] and is waiting for a PCP there.   The history is provided by the patient. No language interpreter was used.    Past Medical History  Diagnosis Date  . COPD (chronic obstructive pulmonary disease)   . Arthritis   . Herniated disc     L6  . Hepatitis C    Past Surgical History  Procedure Laterality Date  . Cervical fusion      c3, c4, c5, c6   History reviewed. No pertinent family history. History  Substance Use Topics  . Smoking status: Current Every Day Smoker    Types: Cigarettes  . Smokeless tobacco: Not on file  . Alcohol Use: Yes     Comment: beer a night    Review of Systems  Musculoskeletal: Positive for neck pain.  All other systems reviewed and are negative.  Allergies  Naproxen  Home Medications   Prior to Admission medications   Medication Sig Start  Date End Date Taking? Authorizing Provider  clindamycin (CLEOCIN) 300 MG capsule Take 300 mg by mouth 3 (three) times daily. 02/08/14   Historical Provider, MD  traMADol (ULTRAM) 50 MG tablet Take 50 mg by mouth every 6 (six) hours as needed for moderate pain.    Historical Provider, MD   BP 121/91  Pulse 74  Temp(Src) 98.2 F (36.8 C) (Oral)  Resp 20  SpO2 100%  Physical Exam  Nursing note and vitals reviewed. Constitutional: He is oriented to person, place, and time. He appears well-developed and well-nourished.  Non-toxic appearance. He does not have a sickly appearance. He does not appear ill. No distress.  HENT:  Head: Normocephalic and atraumatic.  Eyes: Conjunctivae and EOM are normal.  Neck: Neck supple. Spinous process tenderness present. No rigidity.  C-collar in place. No midline C-spine tenderness with no step-offs, crepitus, or deformities noted. Well healed linear scar.   Pulmonary/Chest: Effort normal. No respiratory distress.  Musculoskeletal: Normal range of motion.  Moves all 4 extremities.  Neurological: He is alert and oriented to person, place, and time.  Skin: Skin is warm and dry. He is not diaphoretic.  Psychiatric: He has a normal mood and affect. His behavior is normal.   ED Course  Procedures (including critical care time)  COORDINATION OF CARE: 12:43 PM-Discussed treatment plan which includes speaking with Dr. Fayrene FearingJames  pt at bedside and pt agreed to plan.   12:52 PM-Discussed pt history with Dr. Fayrene Fearing, advises head CT.   Ct Cervical Spine Wo Contrast  02/19/2014   CLINICAL DATA:  Neck pain.  Fall.  EXAM: CT CERVICAL SPINE WITHOUT CONTRAST  TECHNIQUE: Multidetector CT imaging of the cervical spine was performed without intravenous contrast. Multiplanar CT image reconstructions were also generated.  COMPARISON:  01/31/2014.  FINDINGS: Soft tissues unremarkable. Pulmonary apices are clear. Prior anterior and posterior fusion C3 through 6 with good anatomic  alignment. Surgical hardware intact and in stable position from prior exam. Minimal stable retrolisthesis C6 on C7. No evidence of acute fracture or dislocation.  IMPRESSION: 1. Prior anterior and posterior fusion C3 through C6 with good anatomic alignment. No change from prior exam. 2. Minimal degenerative retrolisthesis C6 on C7, unchanged. 3. No acute abnormality identified.   Electronically Signed   By: Maisie Fus  Register   On: 02/19/2014 13:25     EKG Interpretation None      MDM   Final diagnoses:  Neck pain  Personal history of spine surgery   Patient presents after a fall, evaluated for similar complaints in the past. CT negative for acute findings. Lab patient followup with a spine specialist and primary care.  Meds given in ED:  Medications - No data to display  New Prescriptions   TRAMADOL (ULTRAM) 50 MG TABLET    Take 1 tablet (50 mg total) by mouth every 6 (six) hours as needed.   I personally performed the services described in this documentation, which was scribed in my presence. The recorded information has been reviewed and is accurate.  Mellody Drown, PA-C 02/19/14 [phone]

## 2014-02-19 NOTE — ED Provider Notes (Signed)
Medical screening examination/treatment/procedure(s) were performed by non-physician practitioner and as supervising physician I was immediately available for consultation/collaboration.   EKG Interpretation None       Saiquan Hands, MD 02/19/14 0531 

## 2014-02-19 NOTE — Discharge Instructions (Signed)
Call for a follow up appointment with a Family or Primary Care Provider.  °Return if Symptoms worsen.   °Take medication as prescribed.  ° ° °Emergency Department Resource Guide °1) Find a Doctor and Pay Out of Pocket °Although you won't have to find out who is covered by your insurance plan, it is a good idea to ask around and get recommendations. You will then need to call the office and see if the doctor you have chosen will accept you as a new patient and what types of options they offer for patients who are self-pay. Some doctors offer discounts or will set up payment plans for their patients who do not have insurance, but you will need to ask so you aren't surprised when you get to your appointment. ° °2) Contact Your Local Health Department °Not all health departments have doctors that can see patients for sick visits, but many do, so it is worth a call to see if yours does. If you don't know where your local health department is, you can check in your phone book. The CDC also has a tool to help you locate your state's health department, and many state websites also have listings of all of their local health departments. ° °3) Find a Walk-in Clinic °If your illness is not likely to be very severe or complicated, you may want to try a walk in clinic. These are popping up all over the country in pharmacies, drugstores, and shopping centers. They're usually staffed by nurse practitioners or physician assistants that have been trained to treat common illnesses and complaints. They're usually fairly quick and inexpensive. However, if you have serious medical issues or chronic medical problems, these are probably not your best option. ° °No Primary Care Doctor: °- Call [facility] at  832-8000 - they can help you locate a primary care doctor that  accepts your insurance, provides certain services, etc. °- Physician Referral Service- 1-800-533-3463 ° °Chronic Pain Problems: °Organization         Address  Phone    Notes  °[facility]  (336) 297-2271 Patients need to be referred by their primary care doctor.  ° °Medication Assistance: °Organization         Address  Phone   Notes  °[place] [facility] [address] °[place], [place] [address] (336) 641-8030 --Must be a resident of [place] °-- Must have NO insurance coverage whatsoever (no Medicaid/ Medicare, etc.) °-- The pt. MUST have a primary care doctor that directs their care regularly and follows them in the community °  °[facility]  (866) 331-1348   °[employer]  (888) 892-1162   ° °Agencies that provide inexpensive medical care: °Organization         Address  Phone   Notes  °[facility]  (336) 832-8035   °[facility]    (336) 832-7272   °[facility] [address] °[place], [place] [address] (336) 832-4777   °[facility] [address], °[place] (336) 271-4999   °[facility]    (336) 373-0678   °[facility]    (336) 272-1050   °[facility] ° [address], [place] Phone:  (336) 832-4444, Fax:  (336) 832-4440 Hours of Operation:  9 am - 6 pm, M-F.  Also accepts Medicaid/Medicare and self-pay.  °[facility] ° [address],    Phone: (336) 832-3150, Fax: (336) 832-3151. Hours of Operation:  8:30 am - 5:30 pm, M-F.  Also accepts Medicaid and self-pay.  °[facility] [place] [address], [place] Phone: (336) 878-6027   °[facility] [address], [place], [place] (336)723-1848, Ext. 123 Mondays & Thursdays: 7-9 AM.  First 15 patients are seen on a first come, first serve basis. °  ° °Medicaid-accepting [place] Providers: ° °Organization         Address  Phone   Notes  °[facility] [address], [place] (336) 641-2100 Also accepts self-pay patients.  °[facility]  [address], [place] ° (336) 856-9996   °[facility] [address], [place] (336) 288-8857   °[facility] [address] [place] [address], [place] (336) 299-7000   °Veita Bland [address], [place]  ° (336) 373-1557 Only accepts [place] Access Medicaid patients after they have their name applied to their card.  ° °Self-Pay (no insurance) in [place]: ° °Organization         Address  Phone   Notes  °Sickle Cell Patients, [facility] [address], [place] (336) 832-1970   °[facility] [address], [place] (336) 832-4400   °[facility] ° [address], [place] (336) 992-4800   °[facility]/Dr. Osei-Bonsu ° [address] [place] [address], [place] or [address], [place] (336) 841-8500 Phone number for both [place] and [place] locations is the same.  °[facility] [address], [place] (336) 299-0000   °[facility] [address] [place] [address], [place] or [address] (336) 852-7530 °(336) 878-2260   °[facility] [address], [place] (336) 350-1642, phone; (336) 294-5005, fax Sees patients 1st and 3rd Saturday of every month.  Must not qualify for public or private insurance (i.e. Medicaid, Medicare, [place] Health Choice, Veterans' Benefits) • Household income should be no more than 200% of the poverty level •The clinic cannot treat you if you are pregnant or think you are pregnant • Sexually transmitted diseases are not treated at the clinic.  ° ° °Dental Care: °Organization         Address  Phone  Notes  °[facility] [address],  (336) 641-6152 Accepts children up to age 21 who are enrolled in Medicaid or [place] Health Choice; pregnant women with a Medicaid card; and children who have  applied for Medicaid or [place] Health Choice, but were declined, whose parents can pay a reduced fee at time of service.  °[facility] [place]  [address], [place] (336) 641-7733 Accepts children up to age 21 who are enrolled in Medicaid or [place] Health Choice; pregnant women with a Medicaid card; and children who have applied for Medicaid or [place] Health Choice, but were declined, whose parents can pay a reduced fee at time of service.  °[facility] ° [address],  (336) 641-4533 Patients are seen by appointment only. Walk-ins are not accepted. [facility] will see patients 18 years of age and older. °Monday - Tuesday (8am-5pm) °Most Wednesdays (8:30-5pm) °$30 per visit, cash only  °[facility] ° [address], [place] (336) 641-4533 Patients are seen by appointment only. Walk-ins are not accepted. [facility] will see patients 18 years of age and older. °One Wednesday Evening (Monthly: Volunteer Based).  $30 per visit, cash only  °[facility]  (919) 537-3737 for adults; Children under age 4, call Graduate Pediatric Dentistry at (919) 537-3956. Children aged 4-14, please call (919) 537-3737 to request a pediatric application. ° Dental services are provided in all areas of dental care including fillings, crowns and bridges, complete and partial dentures, implants, gum treatment, root canals, and extractions. Preventive care is also provided. Treatment is provided to both adults and children. °Patients are selected via a lottery and there is often a waiting list. °  °[facility] [address], °[place] ° (336) 763-8833 [email or web address] °  °[facility] [address], [place], [place] (336)723-1848, Ext. 123 Second and Fourth Thursday of each month, opens at 6:30 AM; Clinic ends at 9 AM.  Patients are seen on a first-come first-served basis, and a  limited number are seen during each clinic.  ° °[facility] ° [address], [place], [place] (336) 723-7904   Eligibility Requirements °You must have lived in [place], [place], or [place] counties for at least the last three months. °  You cannot be eligible for state or federal sponsored healthcare insurance, including Veterans Administration, Medicaid, or Medicare. °  You generally cannot be eligible for healthcare insurance through your employer.  °  How to apply: °Eligibility screenings are held every Tuesday and Wednesday afternoon from 1:00 pm until 4:00 pm. You do not need an appointment for the interview!  °[facility] [address], [place], [place] 336-631-2330   °[facility]  336-342-8273   °[facility]  336-703-3100   °[facility]  336-570-6415   ° °Behavioral Health Resources in the Community: °Intensive Outpatient Programs °Organization         Address  Phone  Notes  °[facility] [address], [place], [place] 336-878-6098   °[facility] [address], [place], [place] 336-832-9800   °[facility] [address], [place], [place] ° 336-882-2125   °[facility] [address],  °[place], [place] 1-800-853-5163 or 336-641-4981   °Substance Abuse Resources °Organization         Address  Phone  Notes  °[facility]  336-882-2125   °[facility]  336-784-9470   °[facility]  336-285-9073   °[facility]  336-845-3988   °Residential & Outpatient Substance Abuse Program  1-800-659-3381   °Psychological Services °Organization         Address  Phone  Notes  °[facility]  336- 832-9600   °[facility]  336- 378-7881   °[facility] [address], [place] 1-800-853-5163 or 336-641-4981   ° °Mobile Crisis Teams °Organization          Address  Phone  Notes  °[facility], Mobile Crisis Care Unit  1-877-626-1772   °Assertive °[facility] ° [address] [place], [place] 336-834-9664   °Sharon DeEsch [address] °[place]  336-554-5454   ° °Self-Help/Support Groups °Organization         Address  Phone             Notes  °[facility] - variety of   support groups  336- 373-1402 Call for more information  °Narcotics Anonymous (NA), [facility] [address], °[place]  2 meetings at this location  ° °Residential Treatment Programs °Organization         Address  Phone  Notes  °[facility] [address],    °[place]  1-866-801-8205   °[facility] ° [address], [place], [place] 704-293-8524   °[facility] [address], [place] 336-845-3988 Admissions: 8am-3pm M-F  °[facility] [address]    °[place], [place] 336-841-1104   °[facility] [address], [place], [place] 336-379-7146   °[facility] [address]  °[place], [place] 336-285-9073   °[facility] - Intensive Outpatient [address], , [place] 336-852-3033   °[facility] [address]  °[place], [place] 1-877-615-2722 or 336-784-9470   °[facility] ([facility]) [address] , [place] 336-227-7417 Accepts Medicaid  °[facility] [address]  ° [place] 1-800-659-3381 Substance Abuse/Addiction Treatment  ° °[place] Behavioral Health Resources °Organization         Address  Phone  Notes  °[facility]  (888) 581-9988   °Julie Brannon, PhD [address] [place], [place]   (336) 349-5553 or (336) 951-0000   ° Behavioral   [address] °[place], [place] (336) 349-4454   °[facility] [address], [place], [place] (336) 342-8316 Insurance/Medicaid/sponsorship  through [facility]  °[facility] [address]                                    [place], [place] (336) 342-8316 Therapy/tele-psych/case  °[facility] [address]  ° [place], [place] (336) 349-2233    °Dr. Arfeen  (336) 349-4544   °[facility] [place]  [employer] [place] [facility] 1) [address], [place] °2) [address], [place] °3)  [address], [place] (336) 349-3220 °(336) 342-7768 ° °(336) 342-8140   °[place] [facility] (336) 342-1394 or (336) 342-3537 (After Hours)    ° °

## 2014-02-24 NOTE — ED Provider Notes (Signed)
Medical screening examination/treatment/procedure(s) were performed by non-physician practitioner and as supervising physician I was immediately available for consultation/collaboration.   EKG Interpretation None        Rolland Porter, MD 02/24/14 [phone]

## 2014-02-25 ENCOUNTER — Emergency Department (HOSPITAL_COMMUNITY): Payer: Self-pay

## 2014-02-25 ENCOUNTER — Encounter (HOSPITAL_COMMUNITY): Payer: Self-pay | Admitting: Emergency Medicine

## 2014-02-25 ENCOUNTER — Emergency Department (HOSPITAL_COMMUNITY)
Admission: EM | Admit: 2014-02-25 | Discharge: 2014-02-25 | Disposition: A | Discharge status: Home / Self Care | Payer: Self-pay | Attending: Emergency Medicine | Admitting: Emergency Medicine

## 2014-02-25 DIAGNOSIS — Y929 Unspecified place or not applicable: Secondary | ICD-10-CM | POA: Insufficient documentation

## 2014-02-25 DIAGNOSIS — S61409A Unspecified open wound of unspecified hand, initial encounter: Secondary | ICD-10-CM | POA: Insufficient documentation

## 2014-02-25 DIAGNOSIS — Y9389 Activity, other specified: Secondary | ICD-10-CM | POA: Insufficient documentation

## 2014-02-25 DIAGNOSIS — W268XXA Contact with other sharp object(s), not elsewhere classified, initial encounter: Secondary | ICD-10-CM | POA: Insufficient documentation

## 2014-02-25 DIAGNOSIS — S61412A Laceration without foreign body of left hand, initial encounter: Secondary | ICD-10-CM

## 2014-02-25 NOTE — ED Notes (Signed)
Pt c/o left hand laceration from wire cage; bleeding  Controlled

## 2014-02-25 NOTE — Discharge Instructions (Signed)

## 2014-02-25 NOTE — ED Notes (Signed)
Placed bacitracin and placed 4x4 gauze; wrapped in coban.

## 2014-02-25 NOTE — ED Notes (Signed)
Declined W/C at D/C and was escorted to lobby by RN. 

## 2014-02-25 NOTE — ED Provider Notes (Signed)
  Chief Complaint   Chief Complaint  Patient presents with  . Extremity Laceration    History of Present Illness    Ryan Knox is a  53 year old male who presents today with a laceration to the dorsum of his left hand. He sustained this at 8 AM this morning on a wire mesh. He was able to pull a small amount of wire out of the laceration, and he wonders if there could be some more wire in place. 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 His last tetanus shot was 2 weeks ago because of the puncture wound the palm of his hand.  Review of Systems   Other than as noted above, the patient denies any of the following symptoms: Musculoskeletal:  No joint pain or decreased range of motion. Neuro:  No numbness, tingling, or weakness.  PMFSH   Past medical history, family history, social history, meds, and allergies were reviewed. He is allergic to naproxen. He has hepatitis C.  Physical Examination     Vital signs:  BP 118/77  Pulse 71  Temp(Src) 98.3 F (36.8 C) (Oral)  Resp 12  SpO2 100% Ext:  There is a 2.5 cm laceration on the dorsum of the left hand. This is shallow and does not involve tendons. There is no obvious foreign body.  All other joints had a full ROM without pain.  Pulses were full.  Good capillary refill in all digits.  No edema. Neurological:  Alert and oriented.  No muscle weakness.  Sensation was intact to light touch.   Procedure Note:  Verbal informed consent was obtained.  The patient was informed of the risks and benefits of the procedure and understands and accepts.  A time out was called and the identity of the patient and correct procedure were confirmed.   The laceration area described above was prepped with Betadine and saline  and anesthetized with 5 mL of 2% Xylocaine without epi.  The wound was then closed as follows:  The skin edges were approximated with 5 5-0 nylon sutures.  There were no immediate complications, and the  patient tolerated the procedure well. The laceration was then cleansed, Bacitracin ointment was applied and a clean, dry pressure dressing was put on.    Assessment   The encounter diagnosis was Hand laceration, left, initial encounter.  Plan   1.  Meds:  The following meds were prescribed:   Discharge Medication List as of 02/25/2014  3:01 PM      2.  Patient Education/Counseling:  The patient was given appropriate handouts, self care instructions, and instructed in symptomatic relief. Instructions were given for wound care.    3.  Follow up:  The patient was told to follow up immediately if there is any sign of infection.The patient will return in 14 days for suture removal.      Reuben Likes, MD 02/25/14 [phone]

## 2014-03-07 ENCOUNTER — Telehealth (HOSPITAL_BASED_OUTPATIENT_CLINIC_OR_DEPARTMENT_OTHER): Payer: Self-pay | Admitting: Emergency Medicine

## 2014-03-17 ENCOUNTER — Emergency Department (HOSPITAL_COMMUNITY): Payer: Self-pay

## 2014-03-17 ENCOUNTER — Encounter (HOSPITAL_COMMUNITY): Payer: Self-pay | Admitting: Emergency Medicine

## 2014-03-17 ENCOUNTER — Emergency Department (HOSPITAL_COMMUNITY)
Admission: EM | Admit: 2014-03-17 | Discharge: 2014-03-17 | Disposition: A | Discharge status: Home / Self Care | Payer: Self-pay | Attending: Emergency Medicine | Admitting: Emergency Medicine

## 2014-03-17 DIAGNOSIS — Z792 Long term (current) use of antibiotics: Secondary | ICD-10-CM | POA: Insufficient documentation

## 2014-03-17 DIAGNOSIS — Z9889 Other specified postprocedural states: Secondary | ICD-10-CM | POA: Insufficient documentation

## 2014-03-17 DIAGNOSIS — IMO0002 Reserved for concepts with insufficient information to code with codable children: Secondary | ICD-10-CM | POA: Insufficient documentation

## 2014-03-17 DIAGNOSIS — J4489 Other specified chronic obstructive pulmonary disease: Secondary | ICD-10-CM | POA: Insufficient documentation

## 2014-03-17 DIAGNOSIS — Z981 Arthrodesis status: Secondary | ICD-10-CM | POA: Insufficient documentation

## 2014-03-17 DIAGNOSIS — W1789XA Other fall from one level to another, initial encounter: Secondary | ICD-10-CM | POA: Insufficient documentation

## 2014-03-17 DIAGNOSIS — M5441 Lumbago with sciatica, right side: Secondary | ICD-10-CM

## 2014-03-17 DIAGNOSIS — M546 Pain in thoracic spine: Secondary | ICD-10-CM

## 2014-03-17 DIAGNOSIS — Z8619 Personal history of other infectious and parasitic diseases: Secondary | ICD-10-CM | POA: Insufficient documentation

## 2014-03-17 DIAGNOSIS — Y9289 Other specified places as the place of occurrence of the external cause: Secondary | ICD-10-CM | POA: Insufficient documentation

## 2014-03-17 DIAGNOSIS — J449 Chronic obstructive pulmonary disease, unspecified: Secondary | ICD-10-CM | POA: Insufficient documentation

## 2014-03-17 DIAGNOSIS — R209 Unspecified disturbances of skin sensation: Secondary | ICD-10-CM | POA: Insufficient documentation

## 2014-03-17 DIAGNOSIS — Y9389 Activity, other specified: Secondary | ICD-10-CM | POA: Insufficient documentation

## 2014-03-17 DIAGNOSIS — Z8739 Personal history of other diseases of the musculoskeletal system and connective tissue: Secondary | ICD-10-CM | POA: Insufficient documentation

## 2014-03-17 DIAGNOSIS — F17200 Nicotine dependence, unspecified, uncomplicated: Secondary | ICD-10-CM | POA: Insufficient documentation

## 2014-03-17 DIAGNOSIS — M129 Arthropathy, unspecified: Secondary | ICD-10-CM | POA: Insufficient documentation

## 2014-03-17 MED ORDER — TRAMADOL HCL 50 MG PO TABS: 50.0000 mg | ORAL_TABLET | Freq: Four times a day (QID) | ORAL | Status: AC | PRN

## 2014-03-17 MED ORDER — HYDROMORPHONE HCL PF 2 MG/ML IJ SOLN
2.0000 mg | Freq: Once | INTRAMUSCULAR | Status: AC
  Administered 2014-03-17: 2 mg via INTRAMUSCULAR
  Filled 2014-03-17: qty 1

## 2014-03-17 MED ORDER — METHOCARBAMOL 500 MG PO TABS
500.0000 mg | ORAL_TABLET | Freq: Once | ORAL | Status: AC
  Administered 2014-03-17: 500 mg via ORAL
  Filled 2014-03-17: qty 1

## 2014-03-17 MED ORDER — OXYCODONE-ACETAMINOPHEN 5-325 MG PO TABS: 1.0000 | ORAL_TABLET | Freq: Four times a day (QID) | ORAL | Status: DC | PRN

## 2014-03-17 NOTE — Discharge Instructions (Signed)
Please follow up with your primary care physician in 1-2 days. If you do not have one please call the [facility] and wellness [facility] number listed above. Please follow up with your back surgeon to schedule a follow up appointment. Please take pain medication and/or muscle relaxants as prescribed and as needed for pain. Please do not drive on narcotic pain medication or on muscle relaxants. Please read all discharge instructions and return precautions.   Back Pain, Adult Low back pain is very common. About 1 in 5 people have back pain.The cause of low back pain is rarely dangerous. The pain often gets better over time.About half of people with a sudden onset of back pain feel better in just 2 weeks. About 8 in 10 people feel better by 6 weeks.  CAUSES Some common causes of back pain include:  Strain of the muscles or ligaments supporting the spine.  Wear and tear (degeneration) of the spinal discs.  Arthritis.  Direct injury to the back. DIAGNOSIS Most of the time, the direct cause of low back pain is not known.However, back pain can be treated effectively even when the exact cause of the pain is unknown.Answering your caregiver's questions about your overall health and symptoms is one of the most accurate ways to make sure the cause of your pain is not dangerous. If your caregiver needs more information, he or she may order lab work or imaging tests (X-rays or MRIs).However, even if imaging tests show changes in your back, this usually does not require surgery. HOME CARE INSTRUCTIONS For many people, back pain returns.Since low back pain is rarely dangerous, it is often a condition that people can learn to [facility] their own.   Remain active. It is stressful on the back to sit or stand in one place. Do not sit, drive, or stand in one place for more than 30 minutes at a time. Take short walks on level surfaces as soon as pain allows.Try to increase the length of time you walk each  day.  Do not stay in bed.Resting more than 1 or 2 days can delay your recovery.  Do not avoid exercise or work.Your body is made to move.It is not dangerous to be active, even though your back may hurt.Your back will likely heal faster if you return to being active before your pain is gone.  Pay attention to your body when you bend and lift. Many people have less discomfortwhen lifting if they bend their knees, keep the load close to their bodies,and avoid twisting. Often, the most comfortable positions are those that put less stress on your recovering back.  Find a comfortable position to sleep. Use a firm mattress and lie on your side with your knees slightly bent. If you lie on your back, put a pillow under your knees.  Only take over-the-counter or prescription medicines as directed by your caregiver. Over-the-counter medicines to reduce pain and inflammation are often the most helpful.Your caregiver may prescribe muscle relaxant drugs.These medicines help dull your pain so you can more quickly return to your normal activities and healthy exercise.  Put ice on the injured area.  Put ice in a plastic bag.  Place a towel between your skin and the bag.  Leave the ice on for 15-20 minutes, 03-04 times a day for the first 2 to 3 days. After that, ice and heat may be alternated to reduce pain and spasms.  Ask your caregiver about trying back exercises and gentle massage. This may be  of some benefit.  Avoid feeling anxious or stressed.Stress increases muscle tension and can worsen back pain.It is important to recognize when you are anxious or stressed and learn ways to manage it.Exercise is a great option. SEEK MEDICAL CARE IF:  You have pain that is not relieved with rest or medicine.  You have pain that does not improve in 1 week.  You have new symptoms.  You are generally not feeling well. SEEK IMMEDIATE MEDICAL CARE IF:   You have pain that radiates from your back into  your legs.  You develop new bowel or bladder control problems.  You have unusual weakness or numbness in your arms or legs.  You develop nausea or vomiting.  You develop abdominal pain.  You feel faint. Document Released: 06/21/2005 Document Revised: 12/21/2011 Document Reviewed: 10/23/2013 [facility] Patient Information 2015 [place], [place]. This information is not intended to replace advice given to you by your health care provider. Make sure you discuss any questions you have with your health care provider.  Sciatica Sciatica is pain, weakness, numbness, or tingling along the path of the sciatic nerve. The nerve starts in the lower back and runs down the back of each leg. The nerve controls the muscles in the lower leg and in the back of the knee, while also providing sensation to the back of the thigh, lower leg, and the sole of your foot. Sciatica is a symptom of another medical condition. For instance, nerve damage or certain conditions, such as a herniated disk or bone spur on the spine, pinch or put pressure on the sciatic nerve. This causes the pain, weakness, or other sensations normally associated with sciatica. Generally, sciatica only affects one side of the body. CAUSES   Herniated or slipped disc.  Degenerative disk disease.  A pain disorder involving the narrow muscle in the buttocks (piriformis syndrome).  Pelvic injury or fracture.  Pregnancy.  Tumor (rare). SYMPTOMS  Symptoms can vary from mild to very severe. The symptoms usually travel from the low back to the buttocks and down the back of the leg. Symptoms can include:  Mild tingling or dull aches in the lower back, leg, or hip.  Numbness in the back of the calf or sole of the foot.  Burning sensations in the lower back, leg, or hip.  Sharp pains in the lower back, leg, or hip.  Leg weakness.  Severe back pain inhibiting movement. These symptoms may get worse with coughing, sneezing, laughing, or  prolonged sitting or standing. Also, being overweight may worsen symptoms. DIAGNOSIS  Your caregiver will perform a physical exam to look for common symptoms of sciatica. He or she may ask you to do certain movements or activities that would trigger sciatic nerve pain. Other tests may be performed to find the cause of the sciatica. These may include:  Blood tests.  X-rays.  Imaging tests, such as an MRI or CT scan. TREATMENT  Treatment is directed at the cause of the sciatic pain. Sometimes, treatment is not necessary and the pain and discomfort goes away on its own. If treatment is needed, your caregiver may suggest:  Over-the-counter medicines to relieve pain.  Prescription medicines, such as anti-inflammatory medicine, muscle relaxants, or narcotics.  Applying heat or ice to the painful area.  Steroid injections to lessen pain, irritation, and inflammation around the nerve.  Reducing activity during periods of pain.  Exercising and stretching to strengthen your abdomen and improve flexibility of your spine. Your caregiver may suggest losing weight  if the extra weight makes the back pain worse.  Physical therapy.  Surgery to eliminate what is pressing or pinching the nerve, such as a bone spur or part of a herniated disk. HOME CARE INSTRUCTIONS   Only take over-the-counter or prescription medicines for pain or discomfort as directed by your caregiver.  Apply ice to the affected area for 20 minutes, 3-4 times a day for the first 48-72 hours. Then try heat in the same way.  Exercise, stretch, or perform your usual activities if these do not aggravate your pain.  Attend physical therapy sessions as directed by your caregiver.  Keep all follow-up appointments as directed by your caregiver.  Do not wear high heels or shoes that do not provide proper support.  Check your mattress to see if it is too soft. A firm mattress may lessen your pain and discomfort. SEEK IMMEDIATE  MEDICAL CARE IF:   You lose control of your bowel or bladder (incontinence).  You have increasing weakness in the lower back, pelvis, buttocks, or legs.  You have redness or swelling of your back.  You have a burning sensation when you urinate.  You have pain that gets worse when you lie down or awakens you at night.  Your pain is worse than you have experienced in the past.  Your pain is lasting longer than 4 weeks.  You are suddenly losing weight without reason. MAKE SURE YOU:  Understand these instructions.  Will watch your condition.  Will get help right away if you are not doing well or get worse. Document Released: 06/15/2001 Document Revised: 12/21/2011 Document Reviewed: 10/31/2011 [facility] Patient Information 2015 [place], [place]. This information is not intended to replace advice given to you by your health care provider. Make sure you discuss any questions you have with your health care provider.

## 2014-03-17 NOTE — ED Provider Notes (Signed)
CSN: [id number]     Arrival date & time 03/17/14  1623 History  This chart was scribed for non-physician practitioner, Francee Piccolo, PA-C working with Linwood Dibbles, MD by Greggory Stallion, ED scribe. This patient was seen in room WTR6/WTR6 and the patient's care was started at 4:58 PM.   Chief Complaint  Patient presents with  . Fall  . Back Pain   The history is provided by the patient. No language interpreter was used.   HPI Comments: Ryan Knox is a 53 y.o. male with history of back and neck surgeries who presents to the Emergency Department complaining of a fall that occurred earlier today. States he leaned on a porch railing, the rail broke and fell backwards about 4 feet onto his back. Pt hit his head but denies LOC. Reports sudden onset burning mid to lower back pain. States he also has mild tingling in his right leg. Movements worsen pain. Denies abdominal pain, bowel or bladder incontinence, numbness in legs.   Past Medical History  Diagnosis Date  . COPD (chronic obstructive pulmonary disease)   . Arthritis   . Herniated disc     L6  . Hepatitis C    Past Surgical History  Procedure Laterality Date  . Cervical fusion      c3, c4, c5, c6   History reviewed. No pertinent family history. History  Substance Use Topics  . Smoking status: Current Every Day Smoker    Types: Cigarettes  . Smokeless tobacco: Not on file  . Alcohol Use: Yes     Comment: beer a night    Review of Systems  Gastrointestinal: Negative for abdominal pain.  Genitourinary:       Negative for bowel or bladder incontinence.  Musculoskeletal: Positive for back pain.  Neurological: Negative for numbness.  All other systems reviewed and are negative.  Allergies  Naproxen  Home Medications   Prior to Admission medications   Medication Sig Start Date End Date Taking? Authorizing Provider  clindamycin (CLEOCIN) 300 MG capsule Take 300 mg by mouth 3 (three) times daily. Patient not sure when  he started medication 02/08/14   Historical Provider, MD  traMADol (ULTRAM) 50 MG tablet Take 50 mg by mouth every 6 (six) hours as needed for moderate pain.    Historical Provider, MD  traMADol (ULTRAM) 50 MG tablet Take 1 tablet (50 mg total) by mouth every 6 (six) hours as needed for severe pain. 03/17/14   Carnita Golob L Trayson Stitely, PA-C   BP 136/93  Pulse 84  Temp(Src) 98.2 F (36.8 C) (Oral)  Resp 18  SpO2 100%  Physical Exam  Nursing note and vitals reviewed. Constitutional: He is oriented to person, place, and time. He appears well-developed and well-nourished. No distress.  HENT:  Head: Normocephalic and atraumatic.  Right Ear: External ear normal.  Left Ear: External ear normal.  Nose: Nose normal.  Mouth/Throat: Oropharynx is clear and moist. No oropharyngeal exudate.  Eyes: Conjunctivae and EOM are normal. Pupils are equal, round, and reactive to light.  Neck: Normal range of motion. Neck supple.  Cardiovascular: Normal rate, regular rhythm, normal heart sounds and intact distal pulses.   Pulmonary/Chest: Effort normal and breath sounds normal. No respiratory distress.  Abdominal: Soft. There is no tenderness.  Musculoskeletal:       Back:  Neurological: He is alert and oriented to person, place, and time. He has normal strength. No cranial nerve deficit. Gait normal. GCS eye subscore is 4. GCS verbal subscore is 5.  GCS motor subscore is 6.  Sensation grossly intact.  No pronator drift.   Skin: Skin is warm and dry. He is not diaphoretic.    ED Course  Procedures (including critical care time)  DIAGNOSTIC STUDIES: Oxygen Saturation is 100% on RA, normal by my interpretation.    COORDINATION OF CARE: 5:01 PM-Discussed treatment plan which includes xrays and pain medication with pt at bedside and pt agreed to plan.   Labs Review Labs Reviewed - No data to display  Imaging Review Dg Thoracic Spine 2 View  03/17/2014   CLINICAL DATA:  Fall.  EXAM: THORACIC SPINE - 2  VIEW  COMPARISON:  02/17/2014.  FINDINGS: Unchanged mid to lower thoracic compression fractures, unchanged from 02/17/2014. T12-L1 degenerative disc disease. Mild dextroconvex curve of the lower thoracic spine. Partially visualized cervical anterior and posterior fusion.  IMPRESSION: Unchanged mid to lower thoracic compression fractures from recent prior. No new abnormality.   Electronically Signed   By: Andreas Newport M.D.   On: 03/17/2014 17:27   Dg Lumbar Spine Complete  03/17/2014   CLINICAL DATA:  Fall.  Back pain.  Acute injury.  Initial encounter.  EXAM: LUMBAR SPINE - COMPLETE 4+ VIEW  COMPARISON:  None.  FINDINGS: Unchanged grade I retrolisthesis of L 2 on L3 and L3 on L4. L5-S1 and L2-L3 predominant lumbar spondylosis. T12-L1 severe degenerative disc disease. Limbus vertebra present at T11. No fracture. No change in alignment compared to the prior exam. Mild levoconvex curve on frontal view. Bony irregularity is present in the LEFT iliac wing, likely bone graft harvesting site.  IMPRESSION: Unchanged lumbar spondylosis and spondylolisthesis.   Electronically Signed   By: Andreas Newport M.D.   On: 03/17/2014 17:32   Dg Sacrum/coccyx  03/17/2014   CLINICAL DATA:  Larey Seat off porch  EXAM: SACRUM AND COCCYX - 2+ VIEW  COMPARISON:  None.  FINDINGS: Degenerative disc disease is noted at the L5-S1 level. No fractures identified.  IMPRESSION: 1. No acute findings.   Electronically Signed   By: Signa Kell M.D.   On: 03/17/2014 17:28     EKG Interpretation None      MDM   Final diagnoses:  Bilateral thoracic back pain  Bilateral low back pain with right-sided sciatica    Filed Vitals:   03/17/14 1634  BP: 136/93  Pulse: 84  Temp: 98.2 F (36.8 C)  Resp: 18   Afebrile, NAD, non-toxic appearing, AAOx4.  I have reviewed nursing notes, vital signs, and all appropriate lab and imaging results for this patient.  Patient with back pain.  No neurological deficits and normal neuro exam.   Patient can walk but states is painful.  No loss of bowel or bladder control.  No concern for cauda equina.  No fever, night sweats, weight loss, h/o cancer, IVDU.  RICE protocol and pain medicine indicated and discussed with patient.  Patient is stable at time of discharge   I personally performed the services described in this documentation, which was scribed in my presence. The recorded information has been reviewed and is accurate.  Jeannetta Ellis, PA-C 03/17/14 1815

## 2014-03-17 NOTE — ED Notes (Signed)
Pt sts he leaned on porch rail today and rail broke and he fell about 4 feet high on his back. Pt reports pain  From mid-back down to his tailbone. Has hx of back pain and neck surgeries

## 2014-03-17 NOTE — ED Notes (Signed)
Pt requested tramadol instead of oxycodone. Oxy rx shredded in witness of PA.

## 2014-03-20 NOTE — ED Provider Notes (Signed)
Medical screening examination/treatment/procedure(s) were performed by non-physician practitioner and as supervising physician I was immediately available for consultation/collaboration.   EKG Interpretation None        Jarrad Mclees, MD 03/20/14 1508 

## 2014-03-25 ENCOUNTER — Encounter (HOSPITAL_COMMUNITY): Payer: Self-pay | Admitting: Emergency Medicine

## 2014-03-25 ENCOUNTER — Emergency Department (HOSPITAL_COMMUNITY): Payer: Self-pay

## 2014-03-25 ENCOUNTER — Emergency Department (HOSPITAL_COMMUNITY): Payer: MEDICAID

## 2014-03-25 ENCOUNTER — Emergency Department (HOSPITAL_COMMUNITY)
Admission: EM | Admit: 2014-03-25 | Discharge: 2014-03-25 | Disposition: A | Discharge status: Home / Self Care | Payer: Self-pay | Attending: Emergency Medicine | Admitting: Emergency Medicine

## 2014-03-25 DIAGNOSIS — IMO0002 Reserved for concepts with insufficient information to code with codable children: Secondary | ICD-10-CM | POA: Insufficient documentation

## 2014-03-25 DIAGNOSIS — Z981 Arthrodesis status: Secondary | ICD-10-CM | POA: Insufficient documentation

## 2014-03-25 DIAGNOSIS — S8990XA Unspecified injury of unspecified lower leg, initial encounter: Secondary | ICD-10-CM | POA: Insufficient documentation

## 2014-03-25 DIAGNOSIS — S99929A Unspecified injury of unspecified foot, initial encounter: Secondary | ICD-10-CM

## 2014-03-25 DIAGNOSIS — G8929 Other chronic pain: Secondary | ICD-10-CM | POA: Insufficient documentation

## 2014-03-25 DIAGNOSIS — Z8739 Personal history of other diseases of the musculoskeletal system and connective tissue: Secondary | ICD-10-CM | POA: Insufficient documentation

## 2014-03-25 DIAGNOSIS — Y9389 Activity, other specified: Secondary | ICD-10-CM | POA: Insufficient documentation

## 2014-03-25 DIAGNOSIS — F17200 Nicotine dependence, unspecified, uncomplicated: Secondary | ICD-10-CM | POA: Insufficient documentation

## 2014-03-25 DIAGNOSIS — J4489 Other specified chronic obstructive pulmonary disease: Secondary | ICD-10-CM | POA: Insufficient documentation

## 2014-03-25 DIAGNOSIS — M545 Low back pain: Secondary | ICD-10-CM

## 2014-03-25 DIAGNOSIS — J449 Chronic obstructive pulmonary disease, unspecified: Secondary | ICD-10-CM | POA: Insufficient documentation

## 2014-03-25 DIAGNOSIS — Z8619 Personal history of other infectious and parasitic diseases: Secondary | ICD-10-CM | POA: Insufficient documentation

## 2014-03-25 DIAGNOSIS — Z9889 Other specified postprocedural states: Secondary | ICD-10-CM | POA: Insufficient documentation

## 2014-03-25 DIAGNOSIS — Z792 Long term (current) use of antibiotics: Secondary | ICD-10-CM | POA: Insufficient documentation

## 2014-03-25 DIAGNOSIS — S99919A Unspecified injury of unspecified ankle, initial encounter: Secondary | ICD-10-CM

## 2014-03-25 DIAGNOSIS — Y92410 Unspecified street and highway as the place of occurrence of the external cause: Secondary | ICD-10-CM | POA: Insufficient documentation

## 2014-03-25 MED ORDER — HYDROCODONE-ACETAMINOPHEN 5-325 MG PO TABS
2.0000 | ORAL_TABLET | Freq: Once | ORAL | Status: AC
  Administered 2014-03-25: 2 via ORAL
  Filled 2014-03-25: qty 2

## 2014-03-25 MED ORDER — TRAMADOL HCL 50 MG PO TABS: 50.0000 mg | ORAL_TABLET | Freq: Four times a day (QID) | ORAL | Status: AC | PRN

## 2014-03-25 MED ORDER — METHOCARBAMOL 500 MG PO TABS: 1000.0000 mg | ORAL_TABLET | Freq: Two times a day (BID) | ORAL | Status: AC

## 2014-03-25 NOTE — ED Notes (Addendum)
Correction on Triage. Pt has had neck surgery and has not had back surgery and does not report chronic back pain. Pt reports pain is radiating down right leg.

## 2014-03-25 NOTE — ED Notes (Signed)
Patient transported to X-ray 
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
## 2014-03-25 NOTE — ED Notes (Signed)
Coffee given to patient.

## 2014-03-25 NOTE — Progress Notes (Signed)
[facility] Community Liaison Stacy,  ° °Provided pt with a list of primary care resources and a [facility] [place] Card application to help patient establish primary care.  °

## 2014-03-26 NOTE — ED Provider Notes (Signed)
Medical screening examination/treatment/procedure(s) were conducted as a shared visit with non-physician practitioner(s) and myself.  I personally evaluated the patient during the encounter.   EKG Interpretation None       Doug Sou, MD 03/26/14 [phone]

## 2014-04-01 ENCOUNTER — Encounter (HOSPITAL_COMMUNITY): Payer: Self-pay | Admitting: Emergency Medicine

## 2014-04-01 ENCOUNTER — Emergency Department (HOSPITAL_COMMUNITY)
Admission: EM | Admit: 2014-04-01 | Discharge: 2014-04-01 | Disposition: A | Discharge status: Home / Self Care | Payer: Self-pay | Attending: Emergency Medicine | Admitting: Emergency Medicine

## 2014-04-01 ENCOUNTER — Emergency Department (HOSPITAL_COMMUNITY): Payer: Self-pay

## 2014-04-01 DIAGNOSIS — J4489 Other specified chronic obstructive pulmonary disease: Secondary | ICD-10-CM | POA: Insufficient documentation

## 2014-04-01 DIAGNOSIS — M129 Arthropathy, unspecified: Secondary | ICD-10-CM | POA: Insufficient documentation

## 2014-04-01 DIAGNOSIS — Z8619 Personal history of other infectious and parasitic diseases: Secondary | ICD-10-CM | POA: Insufficient documentation

## 2014-04-01 DIAGNOSIS — Z79899 Other long term (current) drug therapy: Secondary | ICD-10-CM | POA: Insufficient documentation

## 2014-04-01 DIAGNOSIS — F17200 Nicotine dependence, unspecified, uncomplicated: Secondary | ICD-10-CM | POA: Insufficient documentation

## 2014-04-01 DIAGNOSIS — Z9889 Other specified postprocedural states: Secondary | ICD-10-CM | POA: Insufficient documentation

## 2014-04-01 DIAGNOSIS — R51 Headache: Secondary | ICD-10-CM | POA: Insufficient documentation

## 2014-04-01 DIAGNOSIS — M545 Low back pain, unspecified: Secondary | ICD-10-CM | POA: Insufficient documentation

## 2014-04-01 DIAGNOSIS — J449 Chronic obstructive pulmonary disease, unspecified: Secondary | ICD-10-CM | POA: Insufficient documentation

## 2014-04-01 DIAGNOSIS — M62838 Other muscle spasm: Secondary | ICD-10-CM

## 2014-04-01 DIAGNOSIS — M542 Cervicalgia: Secondary | ICD-10-CM | POA: Insufficient documentation

## 2014-04-01 MED ORDER — TRAMADOL HCL 50 MG PO TABS
50.0000 mg | ORAL_TABLET | Freq: Four times a day (QID) | ORAL | Status: AC | PRN
Start: 2014-04-01 — End: ?

## 2014-04-01 MED ORDER — HYDROCODONE-ACETAMINOPHEN 5-325 MG PO TABS
1.0000 | ORAL_TABLET | Freq: Once | ORAL | Status: AC
  Administered 2014-04-01: 1 via ORAL
  Filled 2014-04-01: qty 1

## 2014-04-01 MED ORDER — NAPROXEN 500 MG PO TABS: 500.0000 mg | ORAL_TABLET | Freq: Two times a day (BID) | ORAL | Status: AC | PRN

## 2014-04-01 MED ORDER — PREDNISONE 20 MG PO TABS: ORAL_TABLET | ORAL | Status: AC

## 2014-04-01 MED ORDER — RANITIDINE HCL 150 MG PO TABS: 150.0000 mg | ORAL_TABLET | Freq: Two times a day (BID) | ORAL | Status: AC

## 2014-04-01 MED ORDER — METHOCARBAMOL 500 MG PO TABS: 500.0000 mg | ORAL_TABLET | Freq: Three times a day (TID) | ORAL | Status: AC | PRN

## 2014-04-01 NOTE — Discharge Instructions (Signed)
Take naprosyn as directed for inflammation and pain , take with meals and zantac. Take tramadol as directed as needed for breakthrough pain and robaxin for muscle relaxation. Do not drive or operate machinery with pain medication or muscle relaxation use. Use heat over the affected areas, no more than 20 minutes at a time every hour, approx 4 times or more per day. Avoid heavy lifting for 2 weeks, nothing over 10 lbs. Use the prednisone as directed, take with breakfast, which should help with inflammation. Use the resource guide below to follow up with primary care physician for recheck of ongoing symptoms. Return to ER for emergent changing or worsening of symptoms.   Back Pain:  Your back pain should be treated with medicines such as ibuprofen or aleve and this back pain should get better over the next 2 weeks.  However if you develop severe or worsening pain, low back pain with fever, numbness, weakness or inability to walk or urinate, you should return to the ER immediately.  Please follow up with your doctor this week for a recheck if still having symptoms.  Low back pain is discomfort in the lower back that may be due to injuries to muscles and ligaments around the spine.  Occasionally, it may be caused by a a problem to a part of the spine called a disc.  The pain may last several days or a week;  However, most patients get completely well in 4 weeks.  Self - care:  The application of heat can help soothe the pain.  Maintaining your daily activities, including walking, is encourged, as it will help you get better faster than just staying in bed. Perform gentle stretching as discussed. Drink plenty of fluids.  Medications are also useful to help with pain control.  A commonly prescribed medications includes acetaminophen.  This medication is generally safe, though you should not take more than 6 of the extra strength (533m) pills a day.  Non steroidal anti inflammatory medications including  Ibuprofen and naproxen;  These medications help both pain and swelling and are very useful in treating back pain.  They should be taken with food, as they can cause stomach upset, and more seriously, stomach bleeding.    Muscle relaxants:  These medications can help with muscle tightness that is a cause of lower back pain.  Most of these medications can cause drowsiness, and it is not safe to drive or use dangerous machinery while taking them.  SEEK IMMEDIATE MEDICAL ATTENTION IF: New numbness, tingling, weakness, or problem with the use of your arms or legs.  Severe back pain not relieved with medications.  Difficulty with or loss of control of your bowel or bladder control.  Increasing pain in any areas of the body (such as chest or abdominal pain).  Shortness of breath, dizziness or fainting.  Nausea (feeling sick to your stomach), vomiting, fever, or sweats.  You will need to follow up with  Your primary healthcare provider in 1-2 weeks for reassessment.  If you do not have a doctor see the list below.  Back Pain, Adult Low back pain is very common. About 1 in 5 people have back pain.The cause of low back pain is rarely dangerous. The pain often gets better over time.About half of people with a sudden onset of back pain feel better in just 2 weeks. About 8 in 10 people feel better by 6 weeks.  CAUSES Some common causes of back pain include:  Strain of the muscles  or ligaments supporting the spine.  Wear and tear (degeneration) of the spinal discs.  Arthritis.  Direct injury to the back. DIAGNOSIS Most of the time, the direct cause of low back pain is not known.However, back pain can be treated effectively even when the exact cause of the pain is unknown.Answering your caregiver's questions about your overall health and symptoms is one of the most accurate ways to make sure the cause of your pain is not dangerous. If your caregiver needs more information, he or she may order lab  work or imaging tests (X-rays or MRIs).However, even if imaging tests show changes in your back, this usually does not require surgery. HOME CARE INSTRUCTIONS For many people, back pain returns.Since low back pain is rarely dangerous, it is often a condition that people can learn to [facility] their own.   Remain active. It is stressful on the back to sit or stand in one place. Do not sit, drive, or stand in one place for more than 30 minutes at a time. Take short walks on level surfaces as soon as pain allows.Try to increase the length of time you walk each day.  Do not stay in bed.Resting more than 1 or 2 days can delay your recovery.  Do not avoid exercise or work.Your body is made to move.It is not dangerous to be active, even though your back may hurt.Your back will likely heal faster if you return to being active before your pain is gone.  Pay attention to your body when you bend and lift. Many people have less discomfortwhen lifting if they bend their knees, keep the load close to their bodies,and avoid twisting. Often, the most comfortable positions are those that put less stress on your recovering back.  Find a comfortable position to sleep. Use a firm mattress and lie on your side with your knees slightly bent. If you lie on your back, put a pillow under your knees.  Only take over-the-counter or prescription medicines as directed by your caregiver. Over-the-counter medicines to reduce pain and inflammation are often the most helpful.Your caregiver may prescribe muscle relaxant drugs.These medicines help dull your pain so you can more quickly return to your normal activities and healthy exercise.  Put ice on the injured area.  Put ice in a plastic bag.  Place a towel between your skin and the bag.  Leave the ice on for 15-20 minutes, 03-04 times a day for the first 2 to 3 days. After that, ice and heat may be alternated to reduce pain and spasms.  Ask your caregiver about  trying back exercises and gentle massage. This may be of some benefit.  Avoid feeling anxious or stressed.Stress increases muscle tension and can worsen back pain.It is important to recognize when you are anxious or stressed and learn ways to manage it.Exercise is a great option. SEEK MEDICAL CARE IF:  You have pain that is not relieved with rest or medicine.  You have pain that does not improve in 1 week.  You have new symptoms.  You are generally not feeling well. SEEK IMMEDIATE MEDICAL CARE IF:   You have pain that radiates from your back into your legs.  You develop new bowel or bladder control problems.  You have unusual weakness or numbness in your arms or legs.  You develop nausea or vomiting.  You develop abdominal pain.  You feel faint. Document Released: 06/21/2005 Document Revised: 12/21/2011 Document Reviewed: 10/23/2013 [facility] Patient Information 2015 [place], [place]. This information is  not intended to replace advice given to you by your health care provider. Make sure you discuss any questions you have with your health care provider.  Muscle Cramps and Spasms Muscle cramps and spasms are when muscles tighten by themselves. They usually get better within minutes. Muscle cramps are painful. They are usually stronger and last longer than muscle spasms. Muscle spasms may or may not be painful. They can last a few seconds or much longer. HOME CARE  Drink enough fluid to keep your pee (urine) clear or pale yellow.  Massage, stretch, and relax the muscle.  Use a warm towel, heating pad, or warm shower water on tight muscles.  Place ice on the muscle if it is tender or in pain.  Put ice in a plastic bag.  Place a towel between your skin and the bag.  Leave the ice on for 15-20 minutes, 03-04 times a day.  Only take medicine as told by your doctor. GET HELP RIGHT AWAY IF:  Your cramps or spasms get worse, happen more often, or do not get better with  time. MAKE SURE YOU:  Understand these instructions.  Will watch your condition.  Will get help right away if you are not doing well or get worse. Document Released: 06/03/2008 Document Revised: 10/16/2012 Document Reviewed: 06/07/2012 [facility] Patient Information 2015 [place], [place]. This information is not intended to replace advice given to you by your health care provider. Make sure you discuss any questions you have with your health care provider.  Heat Therapy Heat therapy can help make painful, stiff muscles and joints feel better. Do not use heat on new injuries. Wait at least 48 hours after an injury to use heat. Do not use heat when you have aches or pains right after an activity. If you still have pain 3 hours after stopping the activity, then you may use heat. HOME CARE Wet heat pack  Soak a clean towel in warm water. Squeeze out the extra water.  Put the warm, wet towel in a plastic bag.  Place a thin, dry towel between your skin and the bag.  Put the heat pack on the area for 5 minutes, and check your skin. Your skin may be pink, but it should not be red.  Leave the heat pack on the area for 15 to 30 minutes.  Repeat this every 2 to 4 hours while awake. Do not use heat while you are sleeping. Warm water bath  Fill a tub with warm water.  Place the affected body part in the tub.  Soak the area for 20 to 40 minutes.  Repeat as needed. Hot water bottle  Fill the water bottle half full with hot water.  Press out the extra air. Close the cap tightly.  Place a dry towel between your skin and the bottle.  Put the bottle on the area for 5 minutes, and check your skin. Your skin may be pink, but it should not be red.  Leave the bottle on the area for 15 to 30 minutes.  Repeat this every 2 to 4 hours while awake. Electric heating pad  Place a dry towel between your skin and the heating pad.  Set the heating pad on low heat.  Put the heating pad on the area  for 10 minutes, and check your skin. Your skin may be pink, but it should not be red.  Leave the heating pad on the area for 20 to 40 minutes.  Repeat this every 2  to 4 hours while awake.  Do not lie on the heating pad.  Do not fall asleep while using the heating pad.  Do not use the heating pad near water. GET HELP RIGHT AWAY IF:  You get blisters or red skin.  Your skin is puffy (swollen), or you lose feeling (numbness) in the affected area.  You have any new problems.  Your problems are getting worse.  You have any questions or concerns. If you have any problems, stop using heat therapy until you see your doctor. MAKE SURE YOU:  Understand these instructions.  Will watch your condition.  Will get help right away if you are not doing well or get worse. Document Released: 09/13/2011 Document Reviewed: 08/14/2013 [facility] Patient Information 2015 [place]. This information is not intended to replace advice given to you by your health care provider. Make sure you discuss any questions you have with your health care provider.  Back Injury Prevention The following tips can help you to prevent a back injury. PHYSICAL FITNESS  Exercise often. Try to develop strong stomach (abdominal) muscles.  Do aerobic exercises often. This includes walking, jogging, biking, swimming.  Do exercises that help with balance and strength often. This includes tai chi and yoga.  Stretch before and after you exercise.  Keep a healthy weight. DIET   Ask your doctor how much calcium and vitamin D you need every day.  Include calcium in your diet. Foods high in calcium include dairy products; green, leafy vegetables; and products with calcium added (fortified).  Include vitamin D in your diet. Foods high in vitamin D include milk and products with vitamin D added.  Think about taking a multivitamin or other nutritional products called " supplements."  Stop smoking if you  smoke. POSTURE   Sit and stand up straight. Avoid leaning forward or hunching over.  Choose chairs that support your lower back.  If you work at a desk:  Sit close to your work so you do not lean over.  Keep your chin tucked in.  Keep your neck drawn back.  Keep your elbows bent at a right angle. Your arms should look like the letter "L."  Sit high and close to the steering wheel when you drive. Add low back support to your car seat if needed.  Avoid sitting or standing in one position for too long. Get up and move around every hour. Take breaks if you are driving for a long time.  Sleep on your side with your knees slightly bent. You can also sleep on your back with a pillow under your knees. Do not sleep on your stomach. LIFTING, TWISTING, AND REACHING  Avoid heavy lifting, especially lifting over and over again. If you must do heavy lifting:  Stretch before lifting.  Work slowly.  Rest between lifts.  Use carts and dollies to move objects when possible.  Make several small trips instead of carrying 1 heavy load.  Ask for help when you need it.  Ask for help when moving big, awkward objects.  Follow these steps when lifting:  Stand with your feet shoulder-width apart.  Get as close to the object as you can. Do not pick up heavy objects that are far from your body.  Use handles or lifting straps when possible.  Bend at your knees. Squat down, but keep your heels off the floor.  Keep your shoulders back, your chin tucked in, and your back straight.  Lift the object slowly. Tighten the muscles  in your legs, stomach, and butt. Keep the object as close to the center of your body as possible.  Reverse these directions when you put a load down.  Do not:  Lift the object above your waist.  Twist at the waist while lifting or carrying a load. Move your feet if you need to turn, not your waist.  Bend over without bending at your knees.  Avoid reaching over your  head, across a table, or for an object on a high surface. OTHER TIPS  Avoid wet floors and keep sidewalks clear of ice.  Do not sleep on a mattress that is too soft or too hard.  Keep items that you use often within easy reach.  Put heavier objects on shelves at waist level. Put lighter objects on lower or higher shelves.  Find ways to lessen your stress. You can try exercise, massage, or relaxation.  Get help for depression or anxiety if needed. GET HELP IF:  You injure your back.  You have questions about diet, exercise, or other ways to prevent back injuries. MAKE SURE YOU:  Understand these instructions.  Will watch your condition.  Will get help right away if you are not doing well or get worse. Document Released: 12/08/2007 Document Revised: 09/13/2011 Document Reviewed: 08/02/2011 [facility] Patient Information 2015 [place], [place]. This information is not intended to replace advice given to you by your health care provider. Make sure you discuss any questions you have with your health care provider.  Back Exercises Back exercises help treat and prevent back injuries. The goal is to increase your strength in your belly (abdominal) and back muscles. These exercises can also help with flexibility. Start these exercises when told by your doctor. HOME CARE Back exercises include: Pelvic Tilt.  Lie on your back with your knees bent. Tilt your pelvis until the lower part of your back is against the floor. Hold this position 5 to 10 sec. Repeat this exercise 5 to 10 times. Knee to Chest.  Pull 1 knee up against your chest and hold for 20 to 30 seconds. Repeat this with the other knee. This may be done with the other leg straight or bent, whichever feels better. Then, pull both knees up against your chest. Sit-Ups or Curl-Ups.  Bend your knees 90 degrees. Start with tilting your pelvis, and do a partial, slow sit-up. Only lift your upper half 30 to 45 degrees off the floor. Take  at least 2 to 3 seonds for each sit-up. Do not do sit-ups with your knees out straight. If partial sit-ups are difficult, simply do the above but with only tightening your belly (abdominal) muscles and holding it as told. Hip-Lift.  Lie on your back with your knees flexed 90 degrees. Push down with your feet and shoulders as you raise your hips 2 inches off the floor. Hold for 10 seconds, repeat 5 to 10 times. Back Arches.  Lie on your stomach. Prop yourself up on bent elbows. Slowly press on your hands, causing an arch in your low back. Repeat 3 to 5 times. Shoulder-Lifts.  Lie face down with arms beside your body. Keep hips and belly pressed to floor as you slowly lift your head and shoulders off the floor. Do not overdo your exercises. Be careful in the beginning. Exercises may cause you some mild back discomfort. If the pain lasts for more than 15 minutes, stop the exercises until you see your doctor. Improvement with exercise for back problems is slow.  Document Released: 07/24/2010 Document Revised: 09/13/2011 Document Reviewed: 04/22/2011 [facility] Patient Information 2015 [place], [place]. This information is not intended to replace advice given to you by your health care provider. Make sure you discuss any questions you have with your health care provider. Emergency Department Resource Guide 1) Find a Doctor and Pay Out of Pocket Although you won't have to find out who is covered by your insurance plan, it is a good idea to ask around and get recommendations. You will then need to call the office and see if the doctor you have chosen will accept you as a new patient and what types of options they offer for patients who are self-pay. Some doctors offer discounts or will set up payment plans for their patients who do not have insurance, but you will need to ask so you aren't surprised when you get to your appointment.  2) Contact Your Local Health Department Not all health departments have  doctors that can see patients for sick visits, but many do, so it is worth a call to see if yours does. If you don't know where your local health department is, you can check in your phone book. The CDC also has a tool to help you locate your state's health department, and many state websites also have listings of all of their local health departments.  3) Find a [facility] If your illness is not likely to be very severe or complicated, you may want to try a walk in clinic. These are popping up all over the country in pharmacies, drugstores, and shopping centers. They're usually staffed by nurse practitioners or physician assistants that have been trained to treat common illnesses and complaints. They're usually fairly quick and inexpensive. However, if you have serious medical issues or chronic medical problems, these are probably not your best option.  No Primary Care Doctor: - Call [facility] at  [phone] - they can help you locate a primary care doctor that  accepts your insurance, provides certain services, etc. - Physician Referral Service- [phone]  Chronic Pain Problems: Organization         Address  Phone   Notes  [facility]  [phone] Patients need to be referred by their primary care doctor.   Medication Assistance: Organization         Address  Phone   Notes  [facility] Medication [facility] [address] [place], [place] [address] [phone] --Must be a resident of [facility] -- Must have NO insurance coverage whatsoever (no Medicaid/ Medicare, etc.) -- The pt. MUST have a primary care doctor that directs their care regularly and follows them in the community   [facility]  [phone]   [employer]  [phone]    Agencies that provide inexpensive medical care: Organization         Address  Phone   Notes  [facility]  [phone]   [facility]    [phone]    [facility] [place], [place] [address] [phone]   [place] [address] [place] [phone]   [facility]    [phone]   [facility]    [phone]   [place] and [address], [place] Phone:  [phone], Fax:  [phone] Hours of Operation:  9 am - 6 pm, M-F.  Also  accepts Medicaid/Medicare and self-pay.  [facility] for [place], [address], [place] Phone: [phone], Fax: [phone]. Hours of Operation:  8:30 am - 5:30 pm, M-F.  Also accepts Medicaid and self-pay.  [facility] [address] [place] Phone: [phone]   [place], [place], [place] [phone], [address]. [phone] Mondays & Thursdays: 7-9 AM.  First 15 patients are seen on a first come, first serve basis.    [place] Providers:  Organization         Address  Phone   Notes  [facility] [address], [place] [phone] Also accepts self-pay patients.  [facility] [address], [place]  [phone]   [place], [address], [place] [phone]   [facility] [address] [place], [place] [phone]   Lucianne Lei [address], [place]   [phone] Only accepts [place] patients after they have their name applied to their card.   Self-Pay (no insurance) in [facility]:  Organization         Address  Phone   Notes  Sickle Cell Patients, [facility] [place] [phone]   [facility] [place] [phone]   [facility] [place]  [place], [place], [place] [phone]   [facility]/Dr. Osei-Bonsu  [address], [place] or [address], [place] [phone] Phone number for both [place] and [place] locations is the same.  [facility] [address] [place] [phone]   [facility] [address] [place] or [address] [phone]   [facility] [address], [place] [phone], phone; [phone], fax Sees patients 1st and 3rd Saturday of every month.  Must not qualify for public or private insurance (i.e. Medicaid, Medicare, [place] Health Choice, Veterans' Benefits)  Household income should be no more than 200% of the poverty level The clinic cannot treat you if you are pregnant or think you are pregnant  Sexually transmitted diseases are not treated at the clinic.
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
## 2014-04-03 NOTE — ED Provider Notes (Signed)
Medical screening examination/treatment/procedure(s) were performed by non-physician practitioner and as supervising physician I was immediately available for consultation/collaboration.   EKG Interpretation None        Zyliah Schier, MD 04/03/14 1121 

## 2015-10-21 IMAGING — CT CT CERVICAL SPINE W/O CM
4 of 5 series · 13 of 33 positions shown, 15 images · non-contrast
Comparison: None.

CLINICAL DATA: Syncopal episode.  Fall from a standing position.

EXAM:
CT HEAD WITHOUT CONTRAST
CT CERVICAL SPINE WITHOUT CONTRAST
TECHNIQUE: Multidetector CT imaging of the head and cervical spine was
performed following the standard protocol without intravenous
contrast. Multiplanar CT image reconstructions of the cervical spine
were also generated.

[Series 6: c_spine 2.0 i40s 3 · axial · 0.29mm/px · z∈[-227,-127]mm · 4 of 84 slices shown, 5 images]
[im 17/84  soft-tissue]
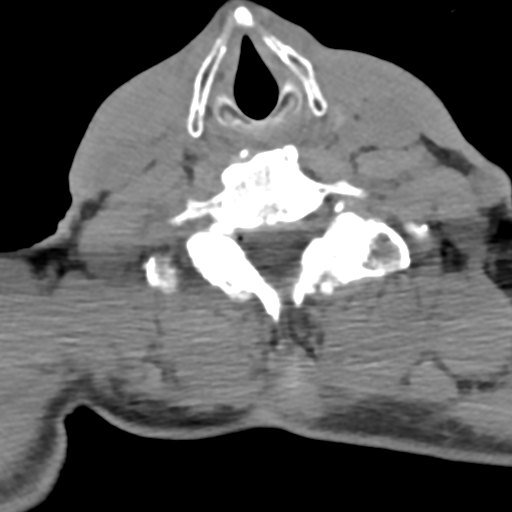
[im 17/84  bone]
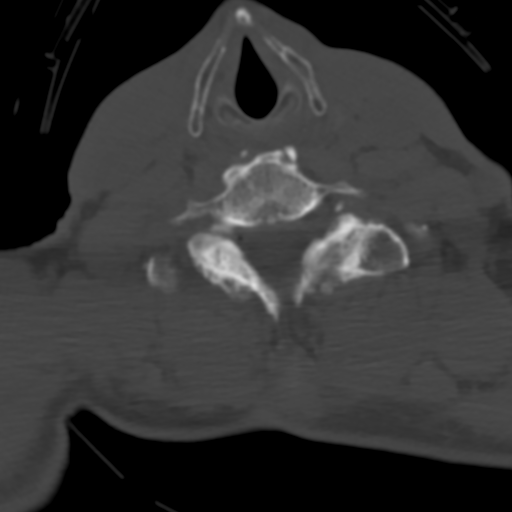
[im 34/84  bone]
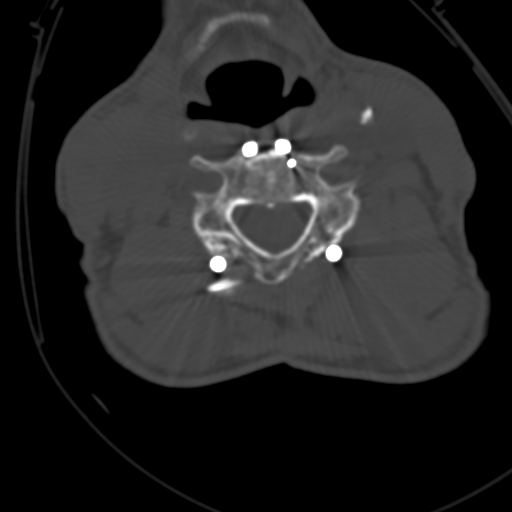
[im 50/84  bone]
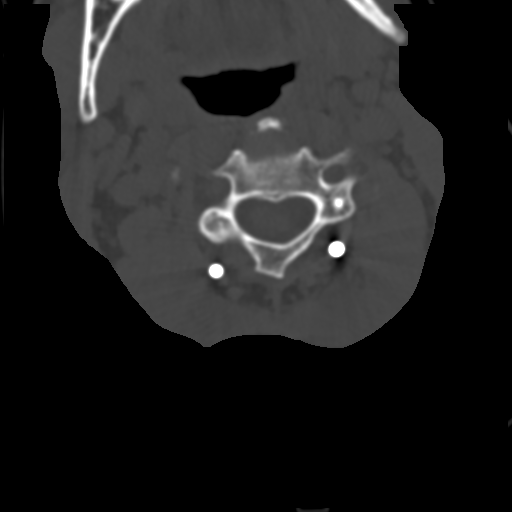
[im 67/84  bone]
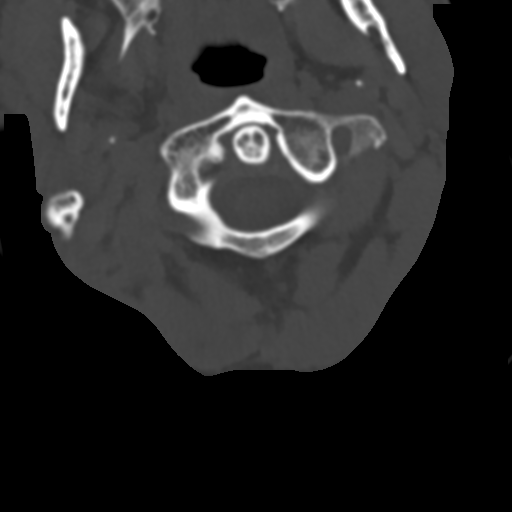

[Series 8: coronals · coronal · 0.25mm/px · 3 of 40 slices shown]
[im 8/40  bone]
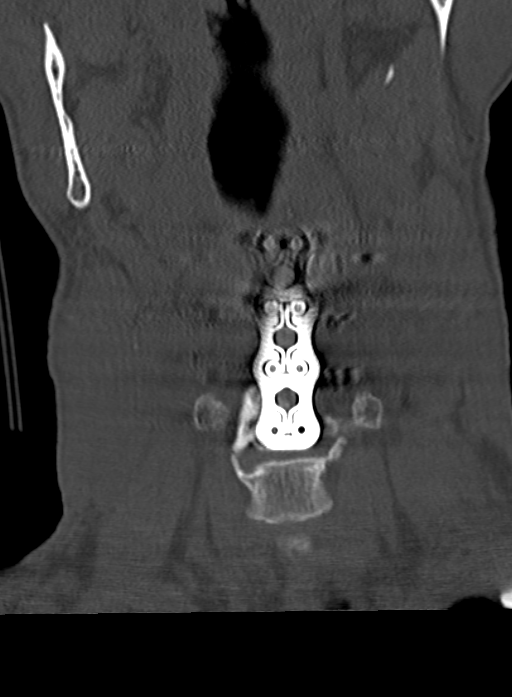
[im 16/40  bone]
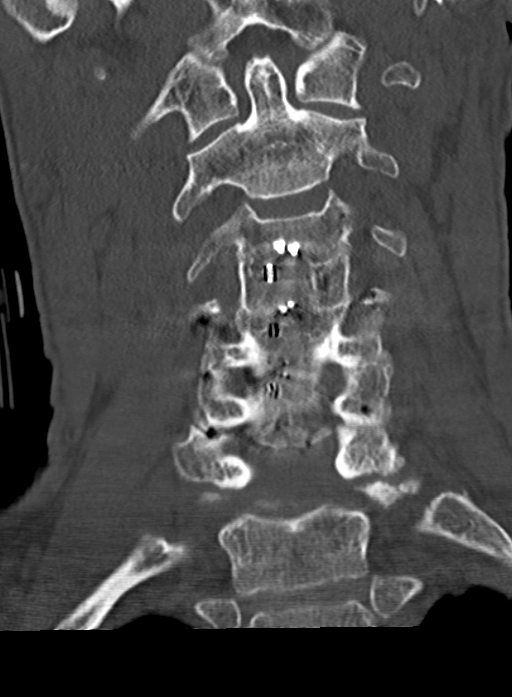
[im 24/40  bone]
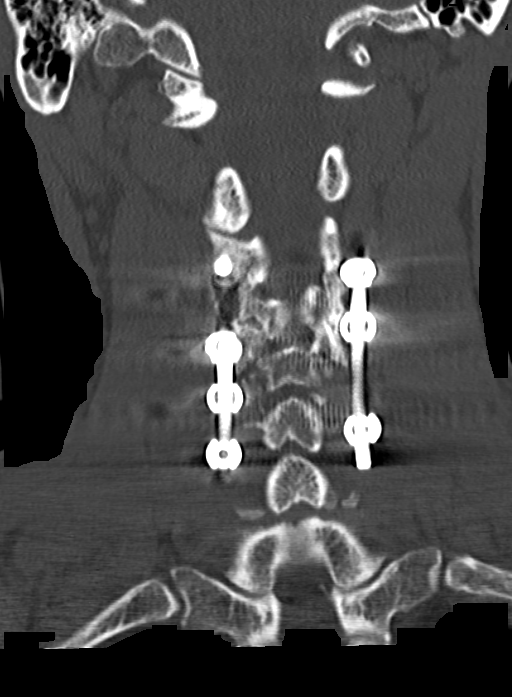

[Series 9: sagittals · sagittal · 0.23mm/px · 5 of 83 slices shown, 6 images]
[im 28/83  bone]
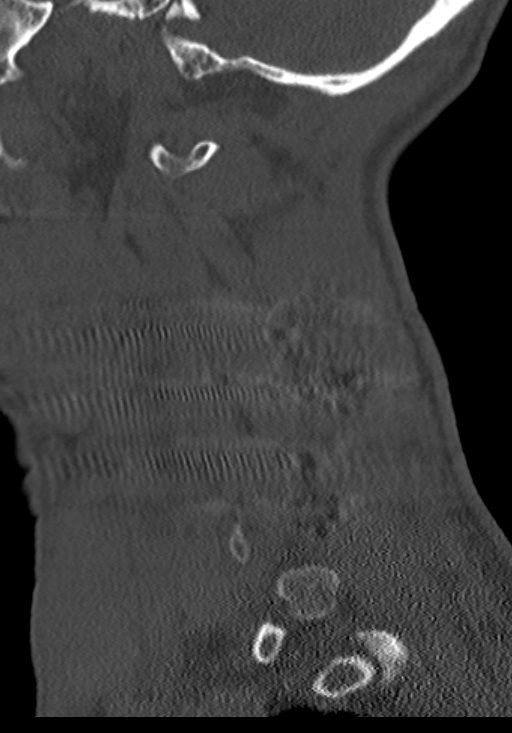
[im 35/83  bone]
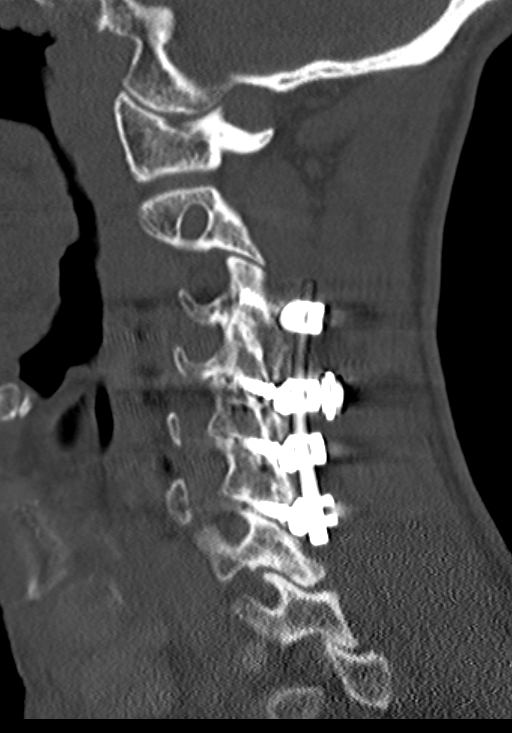
[im 42/83  soft-tissue]
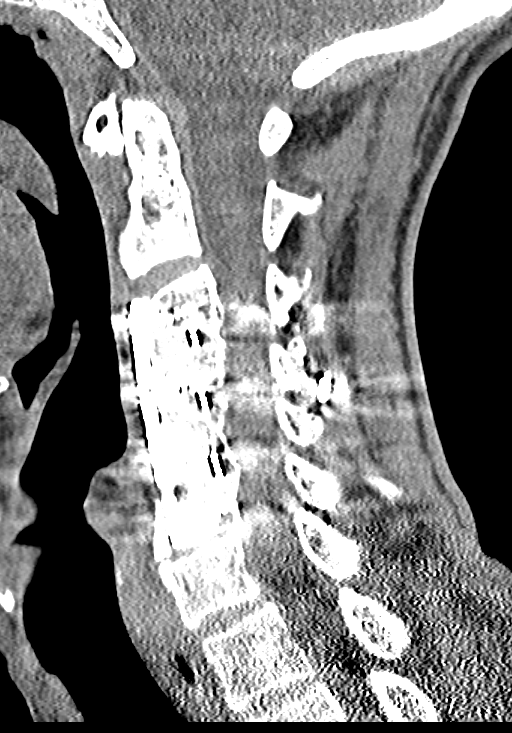
[im 42/83  bone]
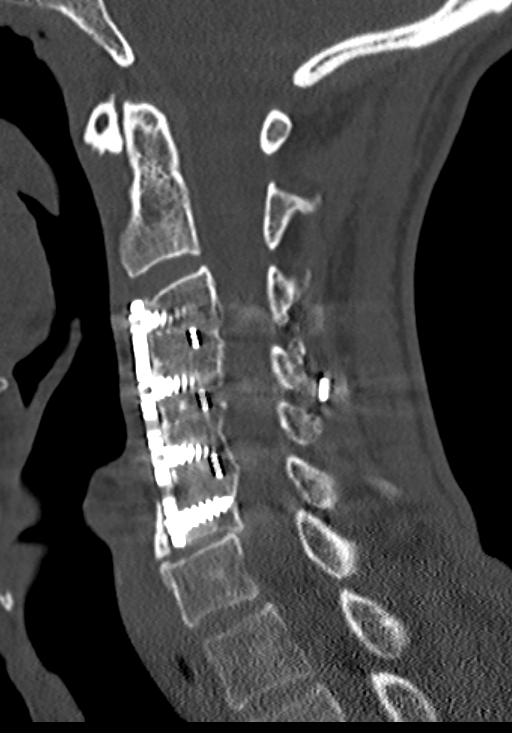
[im 48/83  bone]
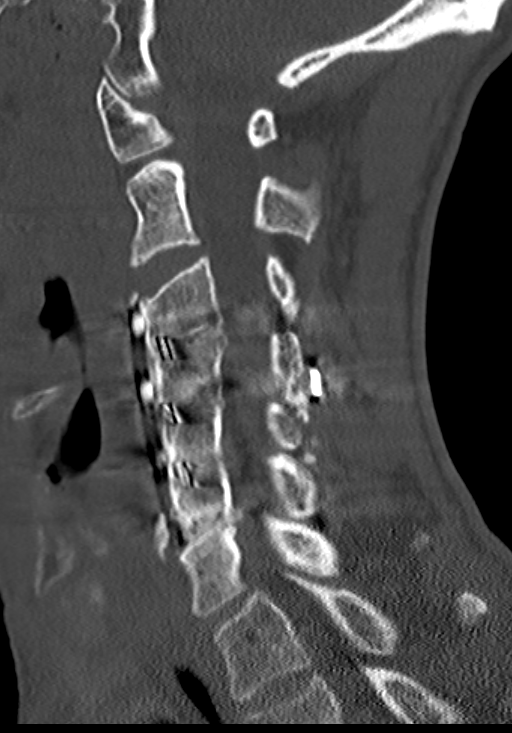
[im 55/83  bone]
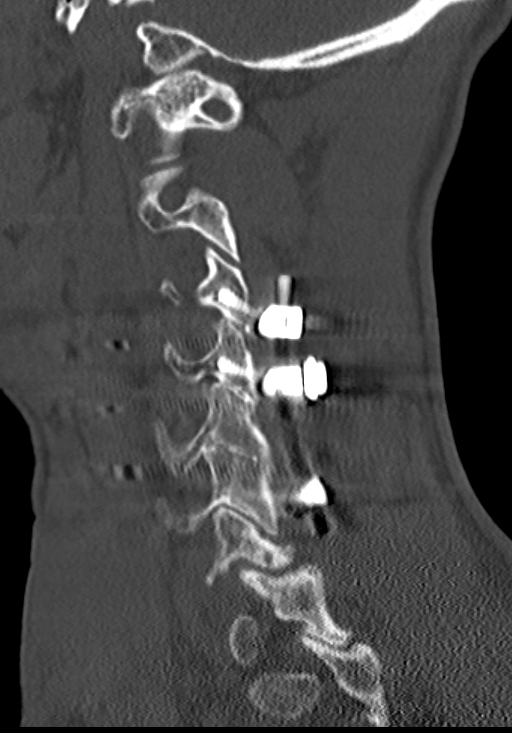

[Series 10: orthogonals · axial · 0.20mm/px · 1 of 83 slices shown]
[im 17/83  bone]
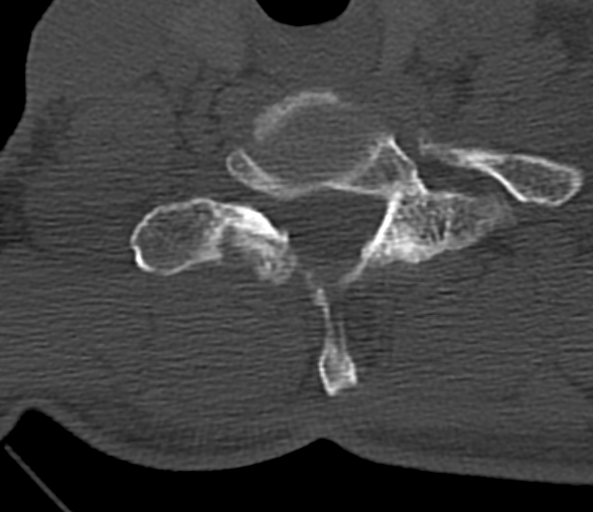

[13 of 33 positions shown; findings below may reference images not displayed]

FINDINGS: CT HEAD FINDINGS

No acute infarct, hemorrhage, or mass lesion is present. The
ventricles are of normal size. No significant extraaxial fluid
collection is present.

Mild mucosal thickening is present throughout the ethmoid air cells,
sphenoid sinuses, and inferior frontal sinuses bilaterally. The
visualized mastoid air cells are clear. The visualized maxillary
sinuses are clear. The osseous skull is intact. No significant
extracranial soft tissue injury is evident.

CT CERVICAL SPINE FINDINGS

The patient is fused anteriorly and posteriorly at C3-4, C4-5, and
C5-6. Fusion is solid. Alignment is anatomic at C2-3. There slight
retrolisthesis and loss of disc height at C6-7. Uncovertebral
spurring results in osseous foraminal narrowing on the left at C6-7.
The hardware is intact. The soft tissues are unremarkable.

No acute fracture is evident.
IMPRESSION: 1. Normal CT appearance of the brain.  No evidence for acute trauma.
2. Mild generalized mucosal thickening throughout the paranasal
sinuses.
3. Solid fusion C3-4, C4-5, and C5-6.
4. Moderate adjacent level disease at C6-7, left greater than right.
5. No acute abnormality of the cervical spine.
